# Patient Record
Sex: Female | Born: 1940 | Race: Black or African American | Hispanic: No | Marital: Single
Health system: Southern US, Community
[De-identification: ages and names within clinical notes are randomized; demographics above are authoritative.]

---

## 2005-11-01 ENCOUNTER — Ambulatory Visit (HOSPITAL_COMMUNITY): Admission: RE | Admit: 2005-11-01 | Discharge: 2005-11-02 | Payer: Self-pay | Admitting: Ophthalmology

## 2010-10-18 ENCOUNTER — Ambulatory Visit: Payer: Self-pay | Admitting: Gastroenterology

## 2010-10-25 ENCOUNTER — Ambulatory Visit: Payer: Self-pay | Admitting: Gastroenterology

## 2010-10-26 LAB — PATHOLOGY REPORT

## 2011-02-01 ENCOUNTER — Ambulatory Visit: Payer: Self-pay | Admitting: Family Medicine

## 2011-05-30 ENCOUNTER — Ambulatory Visit: Payer: Self-pay | Admitting: Gastroenterology

## 2011-05-31 LAB — PATHOLOGY REPORT

## 2011-06-23 ENCOUNTER — Ambulatory Visit: Payer: Self-pay | Admitting: Surgery

## 2011-06-23 LAB — BASIC METABOLIC PANEL
Calcium, Total: 9.2 mg/dL (ref 8.5–10.1)
Creatinine: 0.63 mg/dL (ref 0.60–1.30)
EGFR (Non-African Amer.): >60
Glucose: 98 mg/dL (ref 65–99)
Potassium: 3.4 mmol/L — ABNORMAL LOW (ref 3.5–5.1)
Sodium: 142 mmol/L (ref 136–145)

## 2011-06-30 ENCOUNTER — Inpatient Hospital Stay: Payer: Self-pay | Admitting: Surgery

## 2011-07-01 LAB — BASIC METABOLIC PANEL
Anion Gap: 9 (ref 7–16)
Chloride: 101 mmol/L (ref 98–107)
Co2: 31 mmol/L (ref 21–32)
Creatinine: 0.7 mg/dL (ref 0.60–1.30)
Glucose: 145 mg/dL — ABNORMAL HIGH (ref 65–99)
Osmolality: 281 (ref 275–301)
Potassium: 2.9 mmol/L — ABNORMAL LOW (ref 3.5–5.1)
Sodium: 141 mmol/L (ref 136–145)

## 2011-07-01 LAB — CBC WITH DIFFERENTIAL/PLATELET
Basophil #: 0 10*3/uL (ref 0.0–0.1)
Eosinophil #: 0 10*3/uL (ref 0.0–0.7)
Lymphocyte #: 1.6 10*3/uL (ref 1.0–3.6)
MCH: 26.8 pg (ref 26.0–34.0)
MCHC: 32.9 g/dL (ref 32.0–36.0)
Monocyte #: 1 10*3/uL — ABNORMAL HIGH (ref 0.0–0.7)
Neutrophil %: 74.6 %
Platelet: 151 10*3/uL (ref 150–440)
RDW: 14.6 % — ABNORMAL HIGH (ref 11.5–14.5)

## 2011-07-02 LAB — POTASSIUM: Potassium: 2.7 mmol/L — ABNORMAL LOW (ref 3.5–5.1)

## 2011-07-04 LAB — PATHOLOGY REPORT

## 2011-07-04 LAB — POTASSIUM: Potassium: 3.4 mmol/L — ABNORMAL LOW (ref 3.5–5.1)

## 2011-07-05 ENCOUNTER — Ambulatory Visit: Payer: Self-pay | Admitting: Oncology

## 2011-07-15 ENCOUNTER — Ambulatory Visit: Payer: Self-pay | Admitting: Oncology

## 2011-07-20 ENCOUNTER — Ambulatory Visit: Payer: Self-pay | Admitting: Surgery

## 2011-07-25 LAB — COMPREHENSIVE METABOLIC PANEL
Anion Gap: 9 (ref 7–16)
Bilirubin,Total: 0.5 mg/dL (ref 0.2–1.0)
Chloride: 101 mmol/L (ref 98–107)
Co2: 33 mmol/L — ABNORMAL HIGH (ref 21–32)
Creatinine: 0.68 mg/dL (ref 0.60–1.30)
EGFR (African American): >60
EGFR (Non-African Amer.): >60
Osmolality: 286 (ref 275–301)
Potassium: 3.8 mmol/L (ref 3.5–5.1)
SGPT (ALT): 22 U/L
Sodium: 143 mmol/L (ref 136–145)

## 2011-07-25 LAB — CBC CANCER CENTER
Basophil %: 0.6 %
Eosinophil %: 9.5 %
HCT: 34.5 % — ABNORMAL LOW (ref 35.0–47.0)
Lymphocyte #: 2.1 x10 3/mm (ref 1.0–3.6)
MCV: 80 fL (ref 80–100)
Monocyte %: 10.5 %
Neutrophil #: 3.8 x10 3/mm (ref 1.4–6.5)
RBC: 4.32 10*6/uL (ref 3.80–5.20)
WBC: 7.4 x10 3/mm (ref 3.6–11.0)

## 2011-07-26 LAB — CEA: CEA: 2.4 ng/mL (ref 0.0–4.7)

## 2011-08-01 LAB — BASIC METABOLIC PANEL
BUN: 10 mg/dL (ref 7–18)
Calcium, Total: 8.4 mg/dL — ABNORMAL LOW (ref 8.5–10.1)
Chloride: 102 mmol/L (ref 98–107)
Co2: 34 mmol/L — ABNORMAL HIGH (ref 21–32)
Creatinine: 0.69 mg/dL (ref 0.60–1.30)
Glucose: 115 mg/dL — ABNORMAL HIGH (ref 65–99)
Osmolality: 281 (ref 275–301)
Potassium: 3.8 mmol/L (ref 3.5–5.1)

## 2011-08-01 LAB — CBC CANCER CENTER
Basophil #: 0 x10 3/mm (ref 0.0–0.1)
Lymphocyte #: 1.8 x10 3/mm (ref 1.0–3.6)
MCH: 27.4 pg (ref 26.0–34.0)
MCHC: 34.4 g/dL (ref 32.0–36.0)
MCV: 80 fL (ref 80–100)
Monocyte #: 0.3 x10 3/mm (ref 0.0–0.7)
Monocyte %: 4.9 %
Neutrophil %: 50.8 %
Platelet: 136 x10 3/mm — ABNORMAL LOW (ref 150–440)
RDW: 14.9 % — ABNORMAL HIGH (ref 11.5–14.5)
WBC: 5.4 x10 3/mm (ref 3.6–11.0)

## 2011-08-08 ENCOUNTER — Emergency Department: Payer: Self-pay | Admitting: Emergency Medicine

## 2011-08-08 LAB — URINALYSIS, COMPLETE
Ketone: NEGATIVE
Nitrite: NEGATIVE
Ph: 7 (ref 4.5–8.0)
Protein: NEGATIVE
RBC,UR: 3 /HPF (ref 0–5)

## 2011-08-08 LAB — CBC CANCER CENTER
Basophil #: 0 x10 3/mm (ref 0.0–0.1)
Eosinophil #: 0.3 x10 3/mm (ref 0.0–0.7)
Lymphocyte #: 1.6 x10 3/mm (ref 1.0–3.6)
Lymphocyte %: 32.1 %
Monocyte #: 0.7 x10 3/mm (ref 0.0–0.7)
Neutrophil %: 44.8 %
Platelet: 149 x10 3/mm — ABNORMAL LOW (ref 150–440)
RBC: 4.3 10*6/uL (ref 3.80–5.20)
WBC: 4.8 x10 3/mm (ref 3.6–11.0)

## 2011-08-08 LAB — COMPREHENSIVE METABOLIC PANEL
Albumin: 3.6 g/dL (ref 3.4–5.0)
BUN: 9 mg/dL (ref 7–18)
EGFR (African American): >60
Glucose: 157 mg/dL — ABNORMAL HIGH (ref 65–99)
SGOT(AST): 21 U/L (ref 15–37)
SGPT (ALT): 25 U/L
Total Protein: 6.9 g/dL (ref 6.4–8.2)

## 2011-08-08 LAB — LIPID PANEL: HDL Cholesterol: 47 mg/dL (ref 40–60)

## 2011-08-08 LAB — CK TOTAL AND CKMB (NOT AT ARMC): CK, Total: 79 U/L (ref 21–215)

## 2011-08-12 ENCOUNTER — Ambulatory Visit: Payer: Self-pay | Admitting: Oncology

## 2011-08-22 LAB — CBC CANCER CENTER
Basophil #: 0 x10 3/mm (ref 0.0–0.1)
Basophil %: 0.5 %
Eosinophil #: 0 x10 3/mm (ref 0.0–0.7)
HCT: 31.9 % — ABNORMAL LOW (ref 35.0–47.0)
HGB: 11 g/dL — ABNORMAL LOW (ref 12.0–16.0)
Lymphocyte #: 1.9 x10 3/mm (ref 1.0–3.6)
MCH: 27.9 pg (ref 26.0–34.0)
MCHC: 34.4 g/dL (ref 32.0–36.0)
MCV: 81 fL (ref 80–100)
Neutrophil #: 1.4 x10 3/mm (ref 1.4–6.5)
RDW: 16.9 % — ABNORMAL HIGH (ref 11.5–14.5)

## 2011-08-22 LAB — COMPREHENSIVE METABOLIC PANEL
Albumin: 3.3 g/dL — ABNORMAL LOW (ref 3.4–5.0)
Anion Gap: 8 (ref 7–16)
Bilirubin,Total: 0.3 mg/dL (ref 0.2–1.0)
Calcium, Total: 8.4 mg/dL — ABNORMAL LOW (ref 8.5–10.1)
Co2: 32 mmol/L (ref 21–32)
Creatinine: 0.79 mg/dL (ref 0.60–1.30)
Glucose: 120 mg/dL — ABNORMAL HIGH (ref 65–99)
Osmolality: 287 (ref 275–301)
Potassium: 3.6 mmol/L (ref 3.5–5.1)
Sodium: 144 mmol/L (ref 136–145)

## 2011-09-05 LAB — COMPREHENSIVE METABOLIC PANEL
Albumin: 3.3 g/dL — ABNORMAL LOW (ref 3.4–5.0)
Alkaline Phosphatase: 66 U/L (ref 50–136)
Anion Gap: 7 (ref 7–16)
BUN: 9 mg/dL (ref 7–18)
Calcium, Total: 8.5 mg/dL (ref 8.5–10.1)
Chloride: 103 mmol/L (ref 98–107)
Creatinine: 0.87 mg/dL (ref 0.60–1.30)
Glucose: 147 mg/dL — ABNORMAL HIGH (ref 65–99)
Potassium: 3.3 mmol/L — ABNORMAL LOW (ref 3.5–5.1)
SGOT(AST): 22 U/L (ref 15–37)
SGPT (ALT): 33 U/L
Sodium: 141 mmol/L (ref 136–145)
Total Protein: 6.6 g/dL (ref 6.4–8.2)

## 2011-09-05 LAB — CBC CANCER CENTER
Basophil: 3 %
Eosinophil: 3 %
Lymphocytes: 46 %
MCH: 28.3 pg (ref 26.0–34.0)
MCV: 82 fL (ref 80–100)
Platelet: 121 x10 3/mm — ABNORMAL LOW (ref 150–440)

## 2011-09-12 ENCOUNTER — Ambulatory Visit: Payer: Self-pay | Admitting: Oncology

## 2011-09-19 LAB — CBC CANCER CENTER
Basophil #: 0 x10 3/mm (ref 0.0–0.1)
Basophil %: 0.9 %
Eosinophil %: 2.5 %
HCT: 32.2 % — ABNORMAL LOW (ref 35.0–47.0)
Lymphocyte #: 1.8 x10 3/mm (ref 1.0–3.6)
MCH: 28.3 pg (ref 26.0–34.0)
MCHC: 34.2 g/dL (ref 32.0–36.0)
MCV: 83 fL (ref 80–100)
Monocyte #: 0.7 x10 3/mm (ref 0.0–0.7)
Monocyte %: 17.2 %
Neutrophil %: 36.3 %
Platelet: 103 x10 3/mm — ABNORMAL LOW (ref 150–440)
RDW: 18.9 % — ABNORMAL HIGH (ref 11.5–14.5)
WBC: 4.2 x10 3/mm (ref 3.6–11.0)

## 2011-09-19 LAB — BASIC METABOLIC PANEL
Anion Gap: 8 (ref 7–16)
BUN: 14 mg/dL (ref 7–18)
Calcium, Total: 8.8 mg/dL (ref 8.5–10.1)
Chloride: 102 mmol/L (ref 98–107)
Creatinine: 0.76 mg/dL (ref 0.60–1.30)
EGFR (African American): >60
EGFR (Non-African Amer.): >60
Glucose: 133 mg/dL — ABNORMAL HIGH (ref 65–99)
Osmolality: 286 (ref 275–301)
Potassium: 3.7 mmol/L (ref 3.5–5.1)

## 2011-10-03 LAB — COMPREHENSIVE METABOLIC PANEL
Albumin: 3.3 g/dL — ABNORMAL LOW (ref 3.4–5.0)
Alkaline Phosphatase: 53 U/L (ref 50–136)
Anion Gap: 10 (ref 7–16)
BUN: 10 mg/dL (ref 7–18)
Calcium, Total: 8.4 mg/dL — ABNORMAL LOW (ref 8.5–10.1)
Chloride: 102 mmol/L (ref 98–107)
Co2: 30 mmol/L (ref 21–32)
EGFR (African American): >60
EGFR (Non-African Amer.): >60
Glucose: 163 mg/dL — ABNORMAL HIGH (ref 65–99)
Osmolality: 286 (ref 275–301)
Potassium: 3.6 mmol/L (ref 3.5–5.1)
SGOT(AST): 28 U/L (ref 15–37)
SGPT (ALT): 34 U/L
Sodium: 142 mmol/L (ref 136–145)
Total Protein: 6.4 g/dL (ref 6.4–8.2)

## 2011-10-03 LAB — CBC CANCER CENTER
Basophil %: 1.4 %
Eosinophil #: 0.1 x10 3/mm (ref 0.0–0.7)
HGB: 11.5 g/dL — ABNORMAL LOW (ref 12.0–16.0)
Lymphocyte #: 1.5 x10 3/mm (ref 1.0–3.6)
Lymphocyte %: 41.8 %
MCHC: 33.2 g/dL (ref 32.0–36.0)
MCV: 84 fL (ref 80–100)
Neutrophil #: 1.3 x10 3/mm — ABNORMAL LOW (ref 1.4–6.5)
Neutrophil %: 35.1 %
RBC: 4.14 10*6/uL (ref 3.80–5.20)
RDW: 20.2 % — ABNORMAL HIGH (ref 11.5–14.5)
WBC: 3.6 x10 3/mm (ref 3.6–11.0)

## 2011-10-10 LAB — COMPREHENSIVE METABOLIC PANEL
Alkaline Phosphatase: 59 U/L (ref 50–136)
Calcium, Total: 8.9 mg/dL (ref 8.5–10.1)
Co2: 30 mmol/L (ref 21–32)
Creatinine: 0.75 mg/dL (ref 0.60–1.30)
EGFR (African American): >60
Glucose: 118 mg/dL — ABNORMAL HIGH (ref 65–99)
Osmolality: 282 (ref 275–301)
Potassium: 3.9 mmol/L (ref 3.5–5.1)
SGOT(AST): 34 U/L (ref 15–37)
SGPT (ALT): 30 U/L
Total Protein: 6.5 g/dL (ref 6.4–8.2)

## 2011-10-10 LAB — CBC CANCER CENTER
Basophil #: 0 x10 3/mm (ref 0.0–0.1)
Basophil %: 0.8 %
Eosinophil #: 0.2 x10 3/mm (ref 0.0–0.7)
Eosinophil %: 3.1 %
HCT: 35.6 % (ref 35.0–47.0)
HGB: 11.8 g/dL — ABNORMAL LOW (ref 12.0–16.0)
Lymphocyte #: 2 x10 3/mm (ref 1.0–3.6)
Lymphocyte %: 34.1 %
MCH: 27.9 pg (ref 26.0–34.0)
MCHC: 33.2 g/dL (ref 32.0–36.0)
Monocyte #: 1.3 x10 3/mm — ABNORMAL HIGH (ref 0.2–0.9)
Neutrophil #: 2.4 x10 3/mm (ref 1.4–6.5)
Neutrophil %: 40.1 %
Platelet: 146 x10 3/mm — ABNORMAL LOW (ref 150–440)
RBC: 4.23 10*6/uL (ref 3.80–5.20)
RDW: 20.8 % — ABNORMAL HIGH (ref 11.5–14.5)
WBC: 5.9 x10 3/mm (ref 3.6–11.0)

## 2011-10-12 ENCOUNTER — Ambulatory Visit: Payer: Self-pay | Admitting: Oncology

## 2011-10-17 LAB — CBC CANCER CENTER
Basophil #: 0.1 x10 3/mm (ref 0.0–0.1)
Eosinophil #: 0.1 x10 3/mm (ref 0.0–0.7)
Eosinophil %: 1.8 %
HCT: 34.2 % — ABNORMAL LOW (ref 35.0–47.0)
HGB: 11.2 g/dL — ABNORMAL LOW (ref 12.0–16.0)
Lymphocyte #: 1.6 x10 3/mm (ref 1.0–3.6)
Lymphocyte %: 34.9 %
MCH: 27.8 pg (ref 26.0–34.0)
Monocyte %: 3.3 %
WBC: 4.5 x10 3/mm (ref 3.6–11.0)

## 2011-10-24 LAB — CBC CANCER CENTER
Basophil #: 0 x10 3/mm (ref 0.0–0.1)
Basophil %: 0.6 %
Eosinophil %: 4 %
HCT: 32.9 % — ABNORMAL LOW (ref 35.0–47.0)
HGB: 11.1 g/dL — ABNORMAL LOW (ref 12.0–16.0)
Lymphocyte %: 39.5 %
MCV: 85 fL (ref 80–100)
Monocyte %: 14.2 %
Neutrophil %: 41.7 %
Platelet: 84 x10 3/mm — ABNORMAL LOW (ref 150–440)
RDW: 19.6 % — ABNORMAL HIGH (ref 11.5–14.5)

## 2011-10-24 LAB — COMPREHENSIVE METABOLIC PANEL
Albumin: 3.2 g/dL — ABNORMAL LOW (ref 3.4–5.0)
Anion Gap: 9 (ref 7–16)
BUN: 9 mg/dL (ref 7–18)
Bilirubin,Total: 0.4 mg/dL (ref 0.2–1.0)
Chloride: 101 mmol/L (ref 98–107)
Co2: 31 mmol/L (ref 21–32)
Creatinine: 0.72 mg/dL (ref 0.60–1.30)
EGFR (African American): >60
Osmolality: 283 (ref 275–301)
Potassium: 3.4 mmol/L — ABNORMAL LOW (ref 3.5–5.1)
SGOT(AST): 29 U/L (ref 15–37)
SGPT (ALT): 31 U/L
Sodium: 141 mmol/L (ref 136–145)
Total Protein: 6.3 g/dL — ABNORMAL LOW (ref 6.4–8.2)

## 2011-10-31 LAB — COMPREHENSIVE METABOLIC PANEL
Alkaline Phosphatase: 70 U/L (ref 50–136)
Anion Gap: 8 (ref 7–16)
Chloride: 106 mmol/L (ref 98–107)
Co2: 31 mmol/L (ref 21–32)
Creatinine: 0.74 mg/dL (ref 0.60–1.30)
EGFR (African American): >60
Glucose: 128 mg/dL — ABNORMAL HIGH (ref 65–99)
Osmolality: 289 (ref 275–301)
Potassium: 3.8 mmol/L (ref 3.5–5.1)
SGPT (ALT): 30 U/L
Sodium: 145 mmol/L (ref 136–145)
Total Protein: 6.5 g/dL (ref 6.4–8.2)

## 2011-10-31 LAB — CBC CANCER CENTER
Basophil #: 0.1 x10 3/mm (ref 0.0–0.1)
Basophil %: 1.2 %
Eosinophil #: 0.1 x10 3/mm (ref 0.0–0.7)
Eosinophil %: 3.2 %
HGB: 11.3 g/dL — ABNORMAL LOW (ref 12.0–16.0)
Lymphocyte #: 1.9 x10 3/mm (ref 1.0–3.6)
Lymphocyte %: 40 %
MCV: 86 fL (ref 80–100)
Monocyte %: 23.3 %
Platelet: 200 x10 3/mm (ref 150–440)
WBC: 4.6 x10 3/mm (ref 3.6–11.0)

## 2011-11-08 LAB — CBC CANCER CENTER
Basophil %: 0.8 %
Eosinophil #: 0.1 x10 3/mm (ref 0.0–0.7)
HCT: 33.8 % — ABNORMAL LOW (ref 35.0–47.0)
Lymphocyte %: 32.5 %
MCH: 28.6 pg (ref 26.0–34.0)
Neutrophil #: 3.7 x10 3/mm (ref 1.4–6.5)
Neutrophil %: 59.1 %
RDW: 17.8 % — ABNORMAL HIGH (ref 11.5–14.5)
WBC: 6.2 x10 3/mm (ref 3.6–11.0)

## 2011-11-12 ENCOUNTER — Ambulatory Visit: Payer: Self-pay | Admitting: Oncology

## 2011-11-14 LAB — CBC CANCER CENTER
Basophil %: 0.9 %
Eosinophil #: 0.2 x10 3/mm (ref 0.0–0.7)
HCT: 34 % — ABNORMAL LOW (ref 35.0–47.0)
HGB: 11.3 g/dL — ABNORMAL LOW (ref 12.0–16.0)
Lymphocyte %: 39.6 %
MCH: 28.6 pg (ref 26.0–34.0)
MCHC: 33.3 g/dL (ref 32.0–36.0)
Monocyte #: 0.6 x10 3/mm (ref 0.2–0.9)
Neutrophil #: 1.5 x10 3/mm (ref 1.4–6.5)
RBC: 3.95 10*6/uL (ref 3.80–5.20)
RDW: 18.3 % — ABNORMAL HIGH (ref 11.5–14.5)
WBC: 3.9 x10 3/mm (ref 3.6–11.0)

## 2011-11-14 LAB — COMPREHENSIVE METABOLIC PANEL
Alkaline Phosphatase: 74 U/L (ref 50–136)
Calcium, Total: 8.6 mg/dL (ref 8.5–10.1)
Chloride: 105 mmol/L (ref 98–107)
EGFR (Non-African Amer.): >60
Potassium: 3.5 mmol/L (ref 3.5–5.1)
SGPT (ALT): 26 U/L

## 2011-11-21 LAB — CBC CANCER CENTER
Basophil #: 0.1 x10 3/mm (ref 0.0–0.1)
Basophil %: 1.2 %
Lymphocyte %: 41.4 %
MCH: 28.7 pg (ref 26.0–34.0)
Monocyte #: 1.3 x10 3/mm — ABNORMAL HIGH (ref 0.2–0.9)
Monocyte %: 26.4 %
Neutrophil #: 1.3 x10 3/mm — ABNORMAL LOW (ref 1.4–6.5)
Neutrophil %: 26.6 %
RBC: 4.03 10*6/uL (ref 3.80–5.20)

## 2011-11-28 LAB — CBC CANCER CENTER
Basophil #: 0.1 x10 3/mm (ref 0.0–0.1)
Eosinophil #: 0.1 x10 3/mm (ref 0.0–0.7)
Eosinophil %: 2 %
HCT: 34.1 % — ABNORMAL LOW (ref 35.0–47.0)
HGB: 11.5 g/dL — ABNORMAL LOW (ref 12.0–16.0)
Lymphocyte %: 38.7 %
MCH: 28.7 pg (ref 26.0–34.0)
MCHC: 33.7 g/dL (ref 32.0–36.0)
MCV: 85 fL (ref 80–100)
Monocyte %: 4 %
Neutrophil %: 53.9 %
RBC: 4 10*6/uL (ref 3.80–5.20)

## 2011-12-12 ENCOUNTER — Ambulatory Visit: Payer: Self-pay | Admitting: Oncology

## 2011-12-12 LAB — COMPREHENSIVE METABOLIC PANEL WITH GFR
Albumin: 3.2 g/dL — ABNORMAL LOW
Alkaline Phosphatase: 90 U/L
Anion Gap: 8
BUN: 9 mg/dL
Bilirubin,Total: 0.3 mg/dL
Calcium, Total: 8.4 mg/dL — ABNORMAL LOW
Chloride: 102 mmol/L
Co2: 31 mmol/L
Creatinine: 0.72 mg/dL
EGFR (African American): >60
EGFR (Non-African Amer.): >60
Glucose: 162 mg/dL — ABNORMAL HIGH
Osmolality: 283
Potassium: 3.8 mmol/L
SGOT(AST): 30 U/L
SGPT (ALT): 30 U/L
Sodium: 141 mmol/L
Total Protein: 6.6 g/dL

## 2011-12-12 LAB — CBC CANCER CENTER
Basophil #: 0 "x10 3/mm "
Basophil %: 1.2 %
Eosinophil #: 0.2 "x10 3/mm "
Eosinophil %: 4.2 %
HCT: 34.3 % — ABNORMAL LOW
HGB: 11.6 g/dL — ABNORMAL LOW
Lymphocyte %: 43.3 %
Lymphs Abs: 1.8 "x10 3/mm "
MCH: 29.1 pg
MCHC: 33.7 g/dL
MCV: 86 fL
Monocyte #: 1 "x10 3/mm " — ABNORMAL HIGH
Monocyte %: 24.8 %
Neutrophil #: 1.1 "x10 3/mm " — ABNORMAL LOW
Neutrophil %: 26.5 %
Platelet: 170 "x10 3/mm "
RBC: 3.98 "x10 6/mm "
RDW: 16.7 % — ABNORMAL HIGH
WBC: 4.2 "x10 3/mm "

## 2012-01-02 LAB — CBC CANCER CENTER
Basophil %: 1.4 %
Eosinophil %: 4.4 %
HGB: 11.8 g/dL — ABNORMAL LOW (ref 12.0–16.0)
Lymphocyte #: 1.7 x10 3/mm (ref 1.0–3.6)
Lymphocyte %: 41.4 %
Monocyte %: 23.2 %
Neutrophil %: 29.6 %
RBC: 4.15 10*6/uL (ref 3.80–5.20)
WBC: 4.1 x10 3/mm (ref 3.6–11.0)

## 2012-01-02 LAB — COMPREHENSIVE METABOLIC PANEL
Albumin: 3.2 g/dL — ABNORMAL LOW (ref 3.4–5.0)
BUN: 9 mg/dL (ref 7–18)
Creatinine: 0.77 mg/dL (ref 0.60–1.30)
Glucose: 185 mg/dL — ABNORMAL HIGH (ref 65–99)
Potassium: 3.7 mmol/L (ref 3.5–5.1)
SGOT(AST): 30 U/L (ref 15–37)
Sodium: 142 mmol/L (ref 136–145)
Total Protein: 6.7 g/dL (ref 6.4–8.2)

## 2012-01-12 ENCOUNTER — Ambulatory Visit: Payer: Self-pay | Admitting: Oncology

## 2012-01-23 LAB — COMPREHENSIVE METABOLIC PANEL
Bilirubin,Total: 0.4 mg/dL (ref 0.2–1.0)
Calcium, Total: 9 mg/dL (ref 8.5–10.1)
Chloride: 100 mmol/L (ref 98–107)
Co2: 32 mmol/L (ref 21–32)
Creatinine: 0.81 mg/dL (ref 0.60–1.30)
EGFR (African American): >60
EGFR (Non-African Amer.): >60
SGOT(AST): 32 U/L (ref 15–37)
SGPT (ALT): 34 U/L (ref 12–78)

## 2012-01-23 LAB — CBC CANCER CENTER
Eosinophil %: 3.2 %
Lymphocyte #: 1.9 x10 3/mm (ref 1.0–3.6)
MCH: 28.6 pg (ref 26.0–34.0)
MCHC: 33.9 g/dL (ref 32.0–36.0)
MCV: 84 fL (ref 80–100)
Monocyte %: 25.1 %
Neutrophil #: 1.1 x10 3/mm — ABNORMAL LOW (ref 1.4–6.5)
Neutrophil %: 25.7 %
Platelet: 180 x10 3/mm (ref 150–440)
RDW: 17.5 % — ABNORMAL HIGH (ref 11.5–14.5)
WBC: 4.2 x10 3/mm (ref 3.6–11.0)

## 2012-02-12 ENCOUNTER — Ambulatory Visit: Payer: Self-pay | Admitting: Oncology

## 2012-02-14 LAB — CBC CANCER CENTER
Basophil #: 0.1 x10 3/mm (ref 0.0–0.1)
HGB: 11.5 g/dL — ABNORMAL LOW (ref 12.0–16.0)
Lymphocyte #: 1.9 x10 3/mm (ref 1.0–3.6)
MCHC: 33.4 g/dL (ref 32.0–36.0)
Monocyte %: 24.1 %
Neutrophil #: 1.8 x10 3/mm (ref 1.4–6.5)
RDW: 17.6 % — ABNORMAL HIGH (ref 11.5–14.5)
WBC: 5.1 x10 3/mm (ref 3.6–11.0)

## 2012-02-14 LAB — COMPREHENSIVE METABOLIC PANEL
Albumin: 3.4 g/dL (ref 3.4–5.0)
Anion Gap: 5 — ABNORMAL LOW (ref 7–16)
BUN: 10 mg/dL (ref 7–18)
Bilirubin,Total: 0.3 mg/dL (ref 0.2–1.0)
Chloride: 103 mmol/L (ref 98–107)
Creatinine: 0.76 mg/dL (ref 0.60–1.30)
Glucose: 126 mg/dL — ABNORMAL HIGH (ref 65–99)
Potassium: 3.8 mmol/L (ref 3.5–5.1)
SGOT(AST): 34 U/L (ref 15–37)
Sodium: 141 mmol/L (ref 136–145)
Total Protein: 6.9 g/dL (ref 6.4–8.2)

## 2012-02-22 ENCOUNTER — Ambulatory Visit: Payer: Self-pay | Admitting: Family Medicine

## 2012-03-13 ENCOUNTER — Ambulatory Visit: Payer: Self-pay | Admitting: Oncology

## 2012-03-13 LAB — COMPREHENSIVE METABOLIC PANEL
Albumin: 3.3 g/dL — ABNORMAL LOW (ref 3.4–5.0)
Anion Gap: 9 (ref 7–16)
Calcium, Total: 8.7 mg/dL (ref 8.5–10.1)
Chloride: 103 mmol/L (ref 98–107)
EGFR (African American): >60
EGFR (Non-African Amer.): >60
Glucose: 138 mg/dL — ABNORMAL HIGH (ref 65–99)
Potassium: 3.6 mmol/L (ref 3.5–5.1)
Sodium: 141 mmol/L (ref 136–145)
Total Protein: 7 g/dL (ref 6.4–8.2)

## 2012-03-13 LAB — CBC CANCER CENTER
Eosinophil %: 2.8 %
Lymphocyte %: 28.8 %
MCH: 28 pg (ref 26.0–34.0)
Monocyte #: 0.9 x10 3/mm (ref 0.2–0.9)
Neutrophil %: 52.4 %
Platelet: 148 x10 3/mm — ABNORMAL LOW (ref 150–440)
RBC: 4.25 10*6/uL (ref 3.80–5.20)
WBC: 6.1 x10 3/mm (ref 3.6–11.0)

## 2012-04-13 ENCOUNTER — Ambulatory Visit: Payer: Self-pay | Admitting: Oncology

## 2012-05-13 ENCOUNTER — Ambulatory Visit: Payer: Self-pay | Admitting: Oncology

## 2012-05-15 LAB — CBC CANCER CENTER
Basophil %: 1.2 %
Eosinophil %: 4.9 %
HGB: 12.4 g/dL (ref 12.0–16.0)
Lymphocyte %: 32.8 %
Monocyte %: 11.5 %
Neutrophil %: 49.6 %
Platelet: 146 x10 3/mm — ABNORMAL LOW (ref 150–440)
RBC: 4.54 10*6/uL (ref 3.80–5.20)
WBC: 6.7 x10 3/mm (ref 3.6–11.0)

## 2012-05-15 LAB — COMPREHENSIVE METABOLIC PANEL
Albumin: 3.5 g/dL (ref 3.4–5.0)
BUN: 14 mg/dL (ref 7–18)
Chloride: 103 mmol/L (ref 98–107)
EGFR (African American): >60
Glucose: 119 mg/dL — ABNORMAL HIGH (ref 65–99)
SGOT(AST): 26 U/L (ref 15–37)
SGPT (ALT): 32 U/L (ref 12–78)
Total Protein: 6.8 g/dL (ref 6.4–8.2)

## 2012-05-16 LAB — CEA: CEA: 2 ng/mL (ref 0.0–4.7)

## 2012-06-13 ENCOUNTER — Ambulatory Visit: Payer: Self-pay | Admitting: Oncology

## 2012-07-14 ENCOUNTER — Ambulatory Visit: Payer: Self-pay | Admitting: Oncology

## 2012-08-11 ENCOUNTER — Ambulatory Visit: Payer: Self-pay | Admitting: Oncology

## 2012-08-20 LAB — CBC CANCER CENTER
Basophil #: 0.1 x10 3/mm (ref 0.0–0.1)
Basophil %: 1.1 %
Eosinophil #: 0.2 x10 3/mm (ref 0.0–0.7)
Lymphocyte #: 2.3 x10 3/mm (ref 1.0–3.6)
MCHC: 34.5 g/dL (ref 32.0–36.0)
MCV: 79 fL — ABNORMAL LOW (ref 80–100)
Monocyte %: 10.7 %
Neutrophil %: 49.2 %
Platelet: 134 x10 3/mm — ABNORMAL LOW (ref 150–440)
RBC: 4.8 10*6/uL (ref 3.80–5.20)
RDW: 15.4 % — ABNORMAL HIGH (ref 11.5–14.5)
WBC: 6.5 x10 3/mm (ref 3.6–11.0)

## 2012-09-11 ENCOUNTER — Ambulatory Visit: Payer: Self-pay | Admitting: Oncology

## 2012-10-11 ENCOUNTER — Ambulatory Visit: Payer: Self-pay | Admitting: Oncology

## 2012-11-11 ENCOUNTER — Ambulatory Visit: Payer: Self-pay | Admitting: Oncology

## 2012-11-13 ENCOUNTER — Emergency Department: Payer: Self-pay | Admitting: Emergency Medicine

## 2012-11-13 LAB — CBC WITH DIFFERENTIAL/PLATELET
Basophil %: 0.7 %
Eosinophil #: 0.3 10*3/uL (ref 0.0–0.7)
Eosinophil %: 3.5 %
HCT: 40.6 % (ref 35.0–47.0)
HGB: 14.3 g/dL (ref 12.0–16.0)
Lymphocyte #: 3 10*3/uL (ref 1.0–3.6)
Lymphocyte %: 37.1 %
MCH: 27.7 pg (ref 26.0–34.0)
MCHC: 35.2 g/dL (ref 32.0–36.0)
Monocyte %: 11.1 %
Neutrophil #: 3.9 10*3/uL (ref 1.4–6.5)
Platelet: 161 10*3/uL (ref 150–440)
RBC: 5.16 10*6/uL (ref 3.80–5.20)
RDW: 15.2 % — ABNORMAL HIGH (ref 11.5–14.5)
WBC: 8.2 10*3/uL (ref 3.6–11.0)

## 2012-11-13 LAB — COMPREHENSIVE METABOLIC PANEL
Alkaline Phosphatase: 66 U/L (ref 50–136)
Anion Gap: 5 — ABNORMAL LOW (ref 7–16)
Bilirubin,Total: 0.8 mg/dL (ref 0.2–1.0)
Chloride: 101 mmol/L (ref 98–107)
Creatinine: 0.78 mg/dL (ref 0.60–1.30)
Potassium: 3.4 mmol/L — ABNORMAL LOW (ref 3.5–5.1)
SGPT (ALT): 30 U/L (ref 12–78)
Sodium: 138 mmol/L (ref 136–145)

## 2012-11-13 LAB — URINALYSIS, COMPLETE
Bacteria: NONE SEEN
Glucose,UR: NEGATIVE mg/dL (ref 0–75)
Ketone: NEGATIVE
Leukocyte Esterase: NEGATIVE
RBC,UR: 2 /HPF (ref 0–5)
Specific Gravity: 1.006 (ref 1.003–1.030)
WBC UR: <1 /HPF (ref 0–5)

## 2012-11-13 LAB — MAGNESIUM: Magnesium: 1.4 mg/dL — ABNORMAL LOW

## 2012-11-26 LAB — CBC CANCER CENTER
Basophil %: 1.4 %
Eosinophil #: 0.3 x10 3/mm (ref 0.0–0.7)
HCT: 37.5 % (ref 35.0–47.0)
HGB: 13.3 g/dL (ref 12.0–16.0)
Lymphocyte #: 2.7 x10 3/mm (ref 1.0–3.6)
MCH: 28 pg (ref 26.0–34.0)
MCHC: 35.3 g/dL (ref 32.0–36.0)
MCV: 79 fL — ABNORMAL LOW (ref 80–100)
Monocyte %: 11.9 %
Neutrophil %: 47.7 %
Platelet: 152 x10 3/mm (ref 150–440)
RBC: 4.74 10*6/uL (ref 3.80–5.20)
RDW: 14.7 % — ABNORMAL HIGH (ref 11.5–14.5)

## 2012-11-26 LAB — MAGNESIUM: Magnesium: 1.8 mg/dL

## 2012-11-27 LAB — CEA: CEA: 2.2 ng/mL (ref 0.0–4.7)

## 2012-12-06 ENCOUNTER — Ambulatory Visit: Payer: Self-pay | Admitting: Gastroenterology

## 2012-12-11 ENCOUNTER — Ambulatory Visit: Payer: Self-pay | Admitting: Oncology

## 2013-01-11 ENCOUNTER — Ambulatory Visit: Payer: Self-pay | Admitting: Oncology

## 2013-02-15 ENCOUNTER — Ambulatory Visit: Payer: Self-pay | Admitting: Oncology

## 2013-02-22 ENCOUNTER — Ambulatory Visit: Payer: Self-pay | Admitting: Family Medicine

## 2013-03-05 LAB — CBC CANCER CENTER
Basophil #: 0.1 x10 3/mm (ref 0.0–0.1)
Eosinophil %: 3.7 %
HGB: 13.4 g/dL (ref 12.0–16.0)
Lymphocyte #: 2.6 x10 3/mm (ref 1.0–3.6)
Lymphocyte %: 33.6 %
MCH: 27.2 pg (ref 26.0–34.0)
Monocyte #: 0.8 x10 3/mm (ref 0.2–0.9)
Neutrophil %: 50.7 %
RBC: 4.92 10*6/uL (ref 3.80–5.20)
RDW: 14.5 % (ref 11.5–14.5)
WBC: 7.7 x10 3/mm (ref 3.6–11.0)

## 2013-03-07 LAB — CEA: CEA: 2.4 ng/mL (ref 0.0–4.7)

## 2013-03-13 ENCOUNTER — Ambulatory Visit: Payer: Self-pay | Admitting: Oncology

## 2013-04-13 ENCOUNTER — Ambulatory Visit: Payer: Self-pay | Admitting: Oncology

## 2013-05-13 ENCOUNTER — Ambulatory Visit: Payer: Self-pay | Admitting: Oncology

## 2013-06-13 ENCOUNTER — Ambulatory Visit: Payer: Self-pay | Admitting: Oncology

## 2013-07-12 ENCOUNTER — Ambulatory Visit: Payer: Self-pay | Admitting: Oncology

## 2013-07-12 LAB — CBC CANCER CENTER
BASOS ABS: 0.1 x10 3/mm (ref 0.0–0.1)
Basophil %: 0.7 %
Eosinophil #: 0.3 x10 3/mm (ref 0.0–0.7)
Eosinophil %: 4.5 %
HCT: 37.8 % (ref 35.0–47.0)
HGB: 12.8 g/dL (ref 12.0–16.0)
LYMPHS PCT: 31.7 %
Lymphocyte #: 2.5 x10 3/mm (ref 1.0–3.6)
MCH: 26.6 pg (ref 26.0–34.0)
MCHC: 34 g/dL (ref 32.0–36.0)
MCV: 78 fL — ABNORMAL LOW (ref 80–100)
Monocyte #: 0.8 x10 3/mm (ref 0.2–0.9)
Monocyte %: 10.7 %
Neutrophil #: 4.1 x10 3/mm (ref 1.4–6.5)
Neutrophil %: 52.4 %
PLATELETS: 161 x10 3/mm (ref 150–440)
RBC: 4.82 10*6/uL (ref 3.80–5.20)
RDW: 14.4 % (ref 11.5–14.5)
WBC: 7.7 x10 3/mm (ref 3.6–11.0)

## 2013-07-13 LAB — CEA: CEA: 5.2 ng/mL — ABNORMAL HIGH (ref 0.0–4.7)

## 2013-07-14 ENCOUNTER — Ambulatory Visit: Payer: Self-pay | Admitting: Oncology

## 2013-08-19 ENCOUNTER — Ambulatory Visit: Payer: Self-pay | Admitting: Oncology

## 2013-09-11 ENCOUNTER — Ambulatory Visit: Payer: Self-pay | Admitting: Oncology

## 2013-10-11 ENCOUNTER — Ambulatory Visit: Payer: Self-pay | Admitting: Oncology

## 2013-11-15 ENCOUNTER — Ambulatory Visit: Payer: Self-pay | Admitting: Oncology

## 2013-11-15 LAB — CBC CANCER CENTER
BASOS ABS: 0.1 x10 3/mm (ref 0.0–0.1)
Basophil %: 1.1 %
Eosinophil #: 0.4 x10 3/mm (ref 0.0–0.7)
Eosinophil %: 4.7 %
HCT: 37.4 % (ref 35.0–47.0)
HGB: 12.7 g/dL (ref 12.0–16.0)
LYMPHS ABS: 2.1 x10 3/mm (ref 1.0–3.6)
Lymphocyte %: 28.9 %
MCH: 26.3 pg (ref 26.0–34.0)
MCHC: 33.8 g/dL (ref 32.0–36.0)
MCV: 78 fL — ABNORMAL LOW (ref 80–100)
Monocyte #: 0.8 x10 3/mm (ref 0.2–0.9)
Monocyte %: 10.6 %
Neutrophil #: 4.1 x10 3/mm (ref 1.4–6.5)
Neutrophil %: 54.7 %
Platelet: 175 x10 3/mm (ref 150–440)
RBC: 4.82 10*6/uL (ref 3.80–5.20)
RDW: 14.8 % — ABNORMAL HIGH (ref 11.5–14.5)
WBC: 7.4 x10 3/mm (ref 3.6–11.0)

## 2013-11-18 LAB — CEA: CEA: 30.9 ng/mL — ABNORMAL HIGH (ref 0.0–4.7)

## 2013-12-11 ENCOUNTER — Ambulatory Visit: Payer: Self-pay | Admitting: Oncology

## 2014-01-11 ENCOUNTER — Ambulatory Visit: Payer: Self-pay | Admitting: Oncology

## 2014-01-13 LAB — CEA: CEA: 49 ng/mL — ABNORMAL HIGH (ref 0.0–4.7)

## 2014-02-11 ENCOUNTER — Ambulatory Visit: Payer: Self-pay | Admitting: Oncology

## 2014-02-26 ENCOUNTER — Ambulatory Visit: Payer: Self-pay | Admitting: Family Medicine

## 2014-02-28 ENCOUNTER — Other Ambulatory Visit: Payer: Self-pay | Admitting: Neurology

## 2014-02-28 DIAGNOSIS — M6281 Muscle weakness (generalized): Secondary | ICD-10-CM

## 2014-02-28 DIAGNOSIS — R410 Disorientation, unspecified: Secondary | ICD-10-CM

## 2014-03-06 ENCOUNTER — Ambulatory Visit: Payer: Self-pay | Admitting: Neurology

## 2014-03-06 LAB — CREATININE, SERUM
Creatinine: 0.79 mg/dL (ref 0.60–1.30)
EGFR (Non-African Amer.): >60

## 2014-03-10 LAB — COMPREHENSIVE METABOLIC PANEL
AST: 32 U/L (ref 15–37)
Albumin: 3.7 g/dL (ref 3.4–5.0)
Alkaline Phosphatase: 62 U/L
Anion Gap: 8 (ref 7–16)
BILIRUBIN TOTAL: 0.5 mg/dL (ref 0.2–1.0)
BUN: 20 mg/dL — ABNORMAL HIGH (ref 7–18)
CO2: 29 mmol/L (ref 21–32)
Calcium, Total: 9.6 mg/dL (ref 8.5–10.1)
Chloride: 96 mmol/L — ABNORMAL LOW (ref 98–107)
Creatinine: 0.79 mg/dL (ref 0.60–1.30)
EGFR (Non-African Amer.): >60
Glucose: 185 mg/dL — ABNORMAL HIGH (ref 65–99)
Osmolality: 274 (ref 275–301)
POTASSIUM: 3.5 mmol/L (ref 3.5–5.1)
SGPT (ALT): 45 U/L
SODIUM: 133 mmol/L — AB (ref 136–145)
TOTAL PROTEIN: 7.2 g/dL (ref 6.4–8.2)

## 2014-03-10 LAB — CBC CANCER CENTER
BASOS PCT: 0.1 %
Basophil #: 0 x10 3/mm (ref 0.0–0.1)
EOS PCT: 0 %
Eosinophil #: 0 x10 3/mm (ref 0.0–0.7)
HCT: 39.3 % (ref 35.0–47.0)
HGB: 13.2 g/dL (ref 12.0–16.0)
LYMPHS ABS: 1.2 x10 3/mm (ref 1.0–3.6)
Lymphocyte %: 9.2 %
MCH: 25.6 pg — AB (ref 26.0–34.0)
MCHC: 33.7 g/dL (ref 32.0–36.0)
MCV: 76 fL — ABNORMAL LOW (ref 80–100)
Monocyte #: 0.8 x10 3/mm (ref 0.2–0.9)
Monocyte %: 6.3 %
NEUTROS ABS: 11.2 x10 3/mm — AB (ref 1.4–6.5)
NEUTROS PCT: 84.4 %
PLATELETS: 290 x10 3/mm (ref 150–440)
RBC: 5.17 10*6/uL (ref 3.80–5.20)
RDW: 15.3 % — AB (ref 11.5–14.5)
WBC: 13.3 x10 3/mm — AB (ref 3.6–11.0)

## 2014-03-10 LAB — APTT: Activated PTT: 25.1 secs (ref 23.6–35.9)

## 2014-03-10 LAB — PROTIME-INR
INR: 1.1
PROTHROMBIN TIME: 13.9 s (ref 11.5–14.7)

## 2014-03-11 LAB — CEA: CEA: 87.3 ng/mL — ABNORMAL HIGH (ref 0.0–4.7)

## 2014-03-13 ENCOUNTER — Other Ambulatory Visit: Payer: Self-pay

## 2014-03-13 ENCOUNTER — Ambulatory Visit: Payer: Self-pay | Admitting: Oncology

## 2014-03-17 ENCOUNTER — Ambulatory Visit: Payer: Self-pay | Admitting: Internal Medicine

## 2014-03-21 LAB — COMPREHENSIVE METABOLIC PANEL
ALBUMIN: 3 g/dL — AB (ref 3.4–5.0)
ALK PHOS: 77 U/L
Anion Gap: 7 (ref 7–16)
BUN: 26 mg/dL — ABNORMAL HIGH (ref 7–18)
Bilirubin,Total: 1.1 mg/dL — ABNORMAL HIGH (ref 0.2–1.0)
CHLORIDE: 97 mmol/L — AB (ref 98–107)
CO2: 30 mmol/L (ref 21–32)
CREATININE: 0.77 mg/dL (ref 0.60–1.30)
Calcium, Total: 8.7 mg/dL (ref 8.5–10.1)
EGFR (African American): >60
GLUCOSE: 344 mg/dL — AB (ref 65–99)
OSMOLALITY: 287 (ref 275–301)
Potassium: 4.6 mmol/L (ref 3.5–5.1)
SGOT(AST): 24 U/L (ref 15–37)
SGPT (ALT): 71 U/L — ABNORMAL HIGH
Sodium: 134 mmol/L — ABNORMAL LOW (ref 136–145)
TOTAL PROTEIN: 6.2 g/dL — AB (ref 6.4–8.2)

## 2014-03-21 LAB — CBC CANCER CENTER
BASOS PCT: 0.1 %
Basophil #: 0 x10 3/mm (ref 0.0–0.1)
Eosinophil #: 0 x10 3/mm (ref 0.0–0.7)
Eosinophil %: 0 %
HCT: 41.4 % (ref 35.0–47.0)
HGB: 13.8 g/dL (ref 12.0–16.0)
Lymphocyte #: 0.7 x10 3/mm — ABNORMAL LOW (ref 1.0–3.6)
Lymphocyte %: 3.7 %
MCH: 25.4 pg — ABNORMAL LOW (ref 26.0–34.0)
MCHC: 33.4 g/dL (ref 32.0–36.0)
MCV: 76 fL — AB (ref 80–100)
MONOS PCT: 5.3 %
Monocyte #: 1 x10 3/mm — ABNORMAL HIGH (ref 0.2–0.9)
NEUTROS PCT: 90.9 %
Neutrophil #: 16.8 x10 3/mm — ABNORMAL HIGH (ref 1.4–6.5)
PLATELETS: 171 x10 3/mm (ref 150–440)
RBC: 5.46 10*6/uL — ABNORMAL HIGH (ref 3.80–5.20)
RDW: 16.2 % — ABNORMAL HIGH (ref 11.5–14.5)
WBC: 18.5 x10 3/mm — ABNORMAL HIGH (ref 3.6–11.0)

## 2014-03-23 LAB — CEA: CEA: 347.6 ng/mL — AB (ref 0.0–4.7)

## 2014-03-24 LAB — CBC CANCER CENTER
BASOS PCT: 0.1 %
Basophil #: 0 x10 3/mm (ref 0.0–0.1)
EOS ABS: 0 x10 3/mm (ref 0.0–0.7)
Eosinophil %: 0 %
HCT: 41.1 % (ref 35.0–47.0)
HGB: 13.9 g/dL (ref 12.0–16.0)
Lymphocyte #: 0.4 x10 3/mm — ABNORMAL LOW (ref 1.0–3.6)
Lymphocyte %: 2.3 %
MCH: 25.5 pg — ABNORMAL LOW (ref 26.0–34.0)
MCHC: 33.8 g/dL (ref 32.0–36.0)
MCV: 76 fL — AB (ref 80–100)
Monocyte #: 0.8 x10 3/mm (ref 0.2–0.9)
Monocyte %: 4.7 %
Neutrophil #: 16.2 x10 3/mm — ABNORMAL HIGH (ref 1.4–6.5)
Neutrophil %: 92.9 %
Platelet: 130 x10 3/mm — ABNORMAL LOW (ref 150–440)
RBC: 5.44 10*6/uL — ABNORMAL HIGH (ref 3.80–5.20)
RDW: 16.1 % — AB (ref 11.5–14.5)
WBC: 17.5 x10 3/mm — ABNORMAL HIGH (ref 3.6–11.0)

## 2014-03-25 ENCOUNTER — Ambulatory Visit: Payer: Self-pay | Admitting: Oncology

## 2014-03-25 ENCOUNTER — Other Ambulatory Visit: Payer: Self-pay | Admitting: Diagnostic Radiology

## 2014-03-25 LAB — BASIC METABOLIC PANEL
Anion Gap: 9 (ref 7–16)
BUN: 26 mg/dL — AB (ref 7–18)
Calcium, Total: 8.4 mg/dL — ABNORMAL LOW (ref 8.5–10.1)
Chloride: 98 mmol/L (ref 98–107)
Co2: 27 mmol/L (ref 21–32)
Creatinine: 0.64 mg/dL (ref 0.60–1.30)
Glucose: 336 mg/dL — ABNORMAL HIGH (ref 65–99)
Osmolality: 286 (ref 275–301)
Potassium: 4.3 mmol/L (ref 3.5–5.1)
Sodium: 134 mmol/L — ABNORMAL LOW (ref 136–145)

## 2014-03-25 LAB — PROTIME-INR
INR: 1
Prothrombin Time: 13.4 secs (ref 11.5–14.7)

## 2014-03-28 ENCOUNTER — Inpatient Hospital Stay: Payer: Self-pay | Admitting: Oncology

## 2014-03-28 LAB — CBC CANCER CENTER
BASOS ABS: 0 x10 3/mm (ref 0.0–0.1)
BASOS PCT: 0.1 %
EOS ABS: 0 x10 3/mm (ref 0.0–0.7)
Eosinophil %: 0 %
HCT: 41.7 % (ref 35.0–47.0)
HGB: 14.1 g/dL (ref 12.0–16.0)
LYMPHS ABS: 0.3 x10 3/mm — AB (ref 1.0–3.6)
LYMPHS PCT: 1.6 %
MCH: 25.9 pg — AB (ref 26.0–34.0)
MCHC: 33.8 g/dL (ref 32.0–36.0)
MCV: 77 fL — ABNORMAL LOW (ref 80–100)
MONO ABS: 1 x10 3/mm — AB (ref 0.2–0.9)
MONOS PCT: 5.2 %
NEUTROS ABS: 17.8 x10 3/mm — AB (ref 1.4–6.5)
NEUTROS PCT: 93.1 %
PLATELETS: 97 x10 3/mm — AB (ref 150–440)
RBC: 5.43 10*6/uL — AB (ref 3.80–5.20)
RDW: 16.3 % — AB (ref 11.5–14.5)
WBC: 19.2 x10 3/mm — ABNORMAL HIGH (ref 3.6–11.0)

## 2014-03-28 LAB — URINALYSIS, COMPLETE
BLOOD: NEGATIVE
Bacteria: NONE SEEN
Bilirubin,UR: NEGATIVE
Glucose,UR: >=500 mg/dL (ref 0–75)
Hyaline Cast: 8
Ketone: NEGATIVE
LEUKOCYTE ESTERASE: NEGATIVE
NITRITE: NEGATIVE
PH: 5 (ref 4.5–8.0)
PROTEIN: NEGATIVE
RBC,UR: 2 /HPF (ref 0–5)
Specific Gravity: 1.021 (ref 1.003–1.030)
Squamous Epithelial: <1

## 2014-03-28 LAB — COMPREHENSIVE METABOLIC PANEL
ALK PHOS: 90 U/L
ALT: 94 U/L — AB
Albumin: 3 g/dL — ABNORMAL LOW (ref 3.4–5.0)
Anion Gap: 7 (ref 7–16)
BUN: 22 mg/dL — ABNORMAL HIGH (ref 7–18)
Bilirubin,Total: 1 mg/dL (ref 0.2–1.0)
CREATININE: 0.84 mg/dL (ref 0.60–1.30)
Calcium, Total: 8.5 mg/dL (ref 8.5–10.1)
Chloride: 96 mmol/L — ABNORMAL LOW (ref 98–107)
Co2: 29 mmol/L (ref 21–32)
EGFR (African American): >60
EGFR (Non-African Amer.): >60
Glucose: 324 mg/dL — ABNORMAL HIGH (ref 65–99)
OSMOLALITY: 280 (ref 275–301)
Potassium: 4.2 mmol/L (ref 3.5–5.1)
SGOT(AST): 38 U/L — ABNORMAL HIGH (ref 15–37)
Sodium: 132 mmol/L — ABNORMAL LOW (ref 136–145)
Total Protein: 6.1 g/dL — ABNORMAL LOW (ref 6.4–8.2)

## 2014-03-28 LAB — PROTIME-INR
INR: 1.2
Prothrombin Time: 15.1 secs — ABNORMAL HIGH (ref 11.5–14.7)

## 2014-03-28 LAB — APTT
Activated PTT: 97.2 secs — ABNORMAL HIGH (ref 23.6–35.9)
Activated PTT: 50.9 secs — ABNORMAL HIGH (ref 23.6–35.9)
Activated PTT: <23 secs — ABNORMAL LOW (ref 23.6–35.9)

## 2014-03-29 LAB — CBC WITH DIFFERENTIAL/PLATELET
BASOS ABS: 0 10*3/uL (ref 0.0–0.1)
BASOS PCT: 0.1 %
Eosinophil #: 0 10*3/uL (ref 0.0–0.7)
Eosinophil %: 0 %
HCT: 35.9 % (ref 35.0–47.0)
HGB: 12.4 g/dL (ref 12.0–16.0)
Lymphocyte #: 0.3 10*3/uL — ABNORMAL LOW (ref 1.0–3.6)
Lymphocyte %: 2.5 %
MCH: 26 pg (ref 26.0–34.0)
MCHC: 34.5 g/dL (ref 32.0–36.0)
MCV: 75 fL — ABNORMAL LOW (ref 80–100)
MONO ABS: 0.5 x10 3/mm (ref 0.2–0.9)
MONOS PCT: 4.3 %
Neutrophil #: 11.1 10*3/uL — ABNORMAL HIGH (ref 1.4–6.5)
Neutrophil %: 93.1 %
Platelet: 77 10*3/uL — ABNORMAL LOW (ref 150–440)
RBC: 4.76 10*6/uL (ref 3.80–5.20)
RDW: 16.3 % — AB (ref 11.5–14.5)
WBC: 11.9 10*3/uL — AB (ref 3.6–11.0)

## 2014-03-29 LAB — BASIC METABOLIC PANEL
Anion Gap: 7 (ref 7–16)
BUN: 16 mg/dL (ref 7–18)
CALCIUM: 7.3 mg/dL — AB (ref 8.5–10.1)
CHLORIDE: 102 mmol/L (ref 98–107)
CREATININE: 0.54 mg/dL — AB (ref 0.60–1.30)
Co2: 31 mmol/L (ref 21–32)
EGFR (Non-African Amer.): >60
GLUCOSE: 177 mg/dL — AB (ref 65–99)
OSMOLALITY: 285 (ref 275–301)
POTASSIUM: 4 mmol/L (ref 3.5–5.1)
Sodium: 140 mmol/L (ref 136–145)

## 2014-03-30 LAB — URINE CULTURE

## 2014-03-30 LAB — PLATELET COUNT: Platelet: 70 10*3/uL — ABNORMAL LOW (ref 150–440)

## 2014-04-01 LAB — PATHOLOGY REPORT

## 2014-04-13 ENCOUNTER — Ambulatory Visit: Payer: Self-pay | Admitting: Oncology

## 2014-04-13 DEATH — deceased

## 2014-10-04 NOTE — Consult Note (Signed)
Comments   I met with pt, her sister and nephew. Pt's sister/HCPOA was on phone. Pt has told family that she does not think she is "long for this world" and that her life is not "quality". Pt is not interested in tx of her presumed lung cancer. She wants her care to be focused on her comfort. We discussed the options of home with hospice vs Hospice Home. Pt does not feel she can make it at home and chooses the Hospice Home. Family all support pt and her wishes. Order entered. [facility] Home liason notified and will see pt. Dr Grayland Ormond notified.   Dx: stage IV colon cancer    Secondary Dx: probable lung cancer  Electronic Signatures: Blake Goya, Izora Gala (MD)  (Signed 19-Oct-15 14:16)  Authored: Palliative Care   Last Updated: 19-Oct-15 14:16 by Tomisha Reppucci, Izora Gala (MD)

## 2014-10-04 NOTE — Consult Note (Signed)
Reason for Visit: This 74 year old Female patient presents to the clinic for initial evaluation of  brain metastases .   Referred by Dr. Grayland Ormond.  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 3 lymph nodes were positive tumor was a T2 lesion. She underwent total of 12 cycles of FOLFOX chemotherapy which he tolerated well. Has been doing well up until recently when her CEA began to climb. She presented with right-sided weakness and some confusion mental thoughts. MRI of the brain was performed showing at least 5 hips intracranial masses suggestive of metastatic disease. There is associated significant vasogenic edema. 2 of the larger lesions were in the right and left frontal lobes.she's been started on steroid therapy. She is now referred for additional oncology for consideration of treatment. She's not having any significant headaches or further neurologic problems. She states her right-sided weakness has improved somewhat. PET CT scan is being planned to try to evaluate whether there is further other metastatic disease present.  Past Hx:    arthritis:    diet controlled diabetes:    urge incontinence:    colon cancer:    LE edema:    macular degeneration:    detached retina:    hysterectomy:    fibroids:    tonsils:   Past, Family and Social History:  Past Medical History positive   Cardiovascular hyperlipidemia; hypertension   Genitourinary urge incontinence   Endocrine diabetes mellitus   Past Surgical History tonsillectomy,  hysterectomy, fibroids, detached retina   Past Medical History Comments lower extremity edema, arthritis   Family History positive   Family History Comments family history positive for colon cancer also family history of breast lung cancer CVA hypertension   Social History noncontributory   Additional Past Medical and Surgical History accompanied by daughter today   Allergies:   Other -Explain in Comment Field: Rash  wool allergy: Rash  Home Meds:  Home Medications: Medication Instructions Status  Prevacid 30 mg oral delayed release capsule 1 cap(s) orally once a day Active  Coricidin HBP Maximum Strength Flu oral tablet 2 tab(s) orally every 6 hours, As Needed Active  Super B Complex oral tablet 1 tab(s) orally once a day Active  Mag-Ox 400 1 tab(s)  once a day Active  Glucosamine & Chondroitin with MSM 1 tab(s) orally 2 times a day Active  amLODIPine-atorvastatin 10 mg-10 mg oral tablet 1 tab(s) orally once a day Active  Fish Oil 1000 mg oral capsule 1 cap(s) orally once a day Active  Klor-Con M10 10 mEq oral tablet, extended release 1 tab(s) orally once a day Active  multivitamin 1 tab(s) orally once a day Active  Vitamin C 500 mg oral tablet 1 tab(s) orally once a day Active  vitamin E 400 intl units oral capsule 1 cap(s) orally once a day Active  Gingko Biloba - oral tablet 1 tab(s) orally once a day Active  Move Free 1 tab(s)  once a day Active  dexamethasone 8 milligram(s) orally 3 times a day Active  Toviaz 4 mg oral tablet, extended release 1 tab(s)  orally once a day Active  hydrochlorothiazide 25 mg oral tablet 1 tab(s) orally once a day (in the morning) Active  Calcium 600+D 1 tab(s) orally 2 times a day Active  aspirin 81 mg oral tablet 1 tab(s) orally once a day at hour of sleep Active   Review of Systems:  General negative   Performance Status (ECOG) 1   Skin negative   Breast negative   Ophthalmologic negative   ENMT negative   Respiratory and Thorax  negative   Cardiovascular negative   Gastrointestinal see HPI   Genitourinary negative   Musculoskeletal negative   Neurological negative   Psychiatric negative   Hematology/Lymphatics negative   Endocrine negative   Allergic/Immunologic negative   Review of Systems   except for some visual problems with a history of macular degeneration of the right eye as well as problems with right-sided weaknessdenies any weight loss, fatigue, weakness, fever, chills or night sweats. Patient denies any loss of vision, blurred vision. Patient denies any ringing  of the ears or hearing loss. No irregular heartbeat. Patient denies heart murmur or history of fainting. Patient denies any chest pain or pain radiating to her upper extremities. Patient denies any shortness of breath, difficulty breathing at night, cough or hemoptysis. Patient denies any swelling in the lower legs. Patient denies any nausea vomiting, vomiting of blood, or coffee ground material in the vomitus. Patient denies any stomach pain. Patient states has had normal bowel movements no significant constipation or diarrhea. Patient denies any dysuria, hematuria or significant nocturia. Patient denies any problems walking, swelling in the joints or loss of balance. Patient denies any skin changes, loss of hair or loss of weight. Patient denies any excessive worrying or anxiety or significant depression. Patient denies any problems with insomnia. Patient denies excessive thirst, polyuria, polydipsia. Patient denies any swollen glands, patient denies easy bruising or easy bleeding. Patient denies any recent infections, allergies or URI. Patient "s visual fields have not changed significantly in recent time.   Nursing Notes:  Nursing Vital Signs and Chemo Nursing Nursing Notes: *CC Vital Signs Flowsheet:   28-Sep-15 08:50  Temp Temperature 96.6  Pulse Pulse 94  Respirations Respirations 20  SBP SBP 135  DBP DBP 79  Pain Scale (0-10)  5   Pulse Oxi  97  Current Weight (kg) (kg) 93.7   Physical Exam:  General/Skin/HEENT:  Skin normal   Eyes normal   ENMT normal   Head and Neck normal   Additional PE well-developed female in NAD. She has subtle right-sided weakness of the upper and lower extremity. Left side is normal. Crit visual fields are within normal range. Proprioception is intact. Cranial nerves II through XII are grossly intact. Lungs are clear to A&P cardiac examination shows regular rate and rhythm.   Breasts/Resp/CV/GI/GU:  Respiratory and Thorax normal   Cardiovascular normal   Gastrointestinal normal   Genitourinary normal   MS/Neuro/Psych/Lymph:  Musculoskeletal normal   Neurological normal   Lymphatics normal   Other Results:  Radiology Results: MRI:    24-Sep-15 11:10, MRI Brain  With/Without Contrast  MRI Brain  With/Without Contrast   REASON FOR EXAM:    R sided weakness confusion  COMMENTS:       PROCEDURE: MMR - MMR BRAIN WO/W CONTRAST  - Mar 06 2014 11:10AM     CLINICAL DATA:  Muscle weakness. Unspecified psychosis. History of  colonic malignancy.    EXAM:  MRI HEAD WITHOUT ANDWITH CONTRAST    TECHNIQUE:  Multiplanar, multiecho pulse sequences of the brain and surrounding  structures were obtained without and with intravenous contrast.  CONTRAST:  MultiHance 20 mL.    COMPARISON:  None.    FINDINGS:  No acute stroke or hemorrhage. No hydrocephalus or extra-axial  fluid. Normal for age cerebral volume. No appreciable white matter  disease. No midline abnormality. No osseous findings. Flow voids are  maintained.    Post infusion, there are at least 5 intracranial mass lesions,  suggesting metastases. Two large mass lesions within the cerebral  hemispheres affect the RIGHT frontal and LEFT posterior frontal  regions respectively. There is moderate vasogenic edema surrounding  these two lesions. The large cerebral hemispheric lesions  demonstrate central T2  shortening suggesting mucin, consistent with  origin from colon cancer. No evidence for blood products. No  appreciable midline shift.    The dominant lesion in the RIGHT frontal subcortical white matter  measures 22 x 24 x 19 mm (R-L x A-P x C-C). A slightly smaller  lesion in the LEFT posterior frontal subcortical white matter  measures 17 x 18 x 18 mm. Additional small metastases as seen on  series 14 are seen in the RIGHT parietal cortex convexity (image43,  4 mm), LEFT lateral cerebellum (image 17, 4 mm), and RIGHT anterior  frontal cortex (image 42, 7 mm). Incidental venous angioma LEFT  cerebellum superiorly.    No osseous lesions are evident. There is no tonsillar herniation.  Grossly negative orbits.   IMPRESSION:  At least 5 intracranial mass lesions are present suggesting  metastases. There is marked vasogenic edema associated with the  largest lesions in the RIGHT and LEFT frontal lobes. Central T2  shortening without evidence for blood products suggests mucin,  probable metastatic colon cancer.    I personally discussed the findings with the ordering provider at  the time of interpretation.      Electronically Signed    By: Rolla Flatten M.D.    On: 03/06/2014 11:45     Verified By: Staci Righter, M.D.,   Relevent Results:   Relevant Scans and Labs MRI reviewed   Assessment and Plan: Impression:   brain metastasis with probable colon cancer in 74 year old female. Plan:   I discussed the case personally with medical oncology. We will be running a PET CT to try to identify possible other metastatic disease or persistent disease in this patient and possibly rule out another primary cancer. With 5 history of brain do not feel surgery is indicated. Would treat with whole brain up to 3200 cGy and then boost another 2000 cGy to the 2 larger lobe lesions with IM RT radiation therapy to spare normal brain eyes and brain stem. Risks and benefits of treatment including  possible cognitive decline, skin reaction, hair loss, alteration of blood counts were reviewed in detail with the patient. Her and her daughter both seem to comprehend my treatment plan well. I have set her up for CT simulation tomorrow. We'll make further recommendations once PET CT scan is reviewed.  I would like to take this opportunity for allowing me to participate in the care of your patient..  Fax to Physician:  Physicians To Recieve Fax: Dion Body, MD - [phone].  Electronic Signatures: Rahim Astorga, Roda Shutters (MD)  (Signed 28-Sep-15 14:10)  Authored: HPI, Diagnosis, Past Hx, PFSH, Allergies, Home Meds, ROS, Nursing Notes, Physical Exam, Other Results, Relevent Results, Encounter Assessment and Plan, Fax to Physician   Last Updated: 28-Sep-15 14:10 by Armstead Peaks (  MD) 

## 2014-10-05 NOTE — Op Note (Signed)
PATIENT NAME:  Wendy English, BRAZZEL MR#:  [id number] DATE OF BIRTH:  [birth year]  DATE OF PROCEDURE:  06/30/2011  PREOPERATIVE DIAGNOSIS: Carcinoma of the right colon.   POSTOPERATIVE DIAGNOSIS: Carcinoma of the right colon.   PROCEDURE: Laparoscopic right colectomy.   SURGEON: Renda Rolls, M.D.   ASSISTANT: Carmell Austria, PA  ANESTHESIA: General.   INDICATIONS: This 74 year old female had a colonoscopy demonstrating a cancer of the ascending colon that was an ulcerated mass and surgery was recommended for definitive treatment.   DESCRIPTION OF PROCEDURE: The patient was placed on the operating table in the supine position under general endotracheal anesthesia. The patient is 240 pounds with a BMI of 43.9 and a very large abdomen. This was prepared with ChloraPrep and draped in a sterile manner. I also noted there was an old lower abdominal midline incision related to her pelvic surgery. It is noted also that she has had a previous appendectomy incidental to her pelvic surgery.   Initial incision was made in the supraumbilical area. A small incision was carried down through subcutaneous tissues to encounter the deep fascia which was grasped with a laryngeal hook and elevated. A Veress needle was inserted, aspirated, and irrigated with a saline solution. Next, the peritoneal cavity was inflated with carbon dioxide. The Veress needle was removed. The 10 mm cannula was inserted. The 10 mm 30 degree laparoscope was inserted to view the peritoneal cavity. The location of the right colon was determined. The liver had no visible mass, but did have appearance of a fatty liver. Another incision was made in the infraumbilical area, in an old scar, for another 11 mm cannula. Another incision was made in the epigastrium, in the midline, for another 11 mm cannula. Another incision was made in the right mid abdomen to introduce an 11 mm cannula for a total of four cannulas. Next, the patient was tilted towards the  left side. The right colon was mobilized with incision of the lateral peritoneal reflection using the Harmonic scalpel. I noted the location of tattooing of the ascending colon and some of the dissection was carried out through some tattooed fatty tissue, although this appeared to be apart from the actual tumor. There were multiple adhesions in the pelvis, around her right ovary, and multiple adhesions between the small bowel and the pelvic sidewall. The patient was placed in Trendelenburg position and dissection was carried out mobilizing small bowel which some required extensive lysis of adhesions and subsequently freed up the terminal ileum to make it mobile enough for anastomosis. Next, further dissection was carried out along the ascending colon and hepatic flexure. Next, a portion of the omentum was separated from the right transverse colon and the right transverse colon was further dissected with the Harmonic scalpel mobilizing and identifying the duodenum and the mesentery was further separated from the duodenum, and after satisfactory mobilization of the colon by going back and forth between the transverse colon and the cecum and ascending colon, it was all mobilized satisfactorily for resection. Next, the laparoscopic instruments were removed. An incision was made from the supraumbilical port site to the epigastric port site and carried down through subcutaneous tissues through the midline fascia. It was found that the patient was quite obese and the incision was more than 2 inches deep to reach the fascia and it was necessary to lengthen the incision several times for exposure. There was marked palpable adenopathy of the mesentery. One lymph node was some 3 cm in dimension. The terminal ileum  was further examined and a point some 4 cm proximal to the ileocecal valve was selected for the proximal margin of resection. A window was created in the mesentery and the mesenteric dissection was begun with the  Harmonic scalpel. Next, a window was created in the transverse colon just to the right at the site of the middle colic vessels to create an opening to begin the mesenteric dissection of the transverse colon, which was carried out with the Harmonic scalpel. It is noted that there was marked adenopathy in the region of the ileocolic vessels. One ileocolic artery was divided with 0 chromic ligatures proximally and distally and divided with the Harmonic scalpel. A V-shaped dissection was carried out and at the apex of the V there was an approximately 3 cm lymph node. There was an artery leading up to the lymph node which was suture ligated with 0 chromic and also another vein that was suture ligated with 0 chromic. This was somewhat deep within the mesentery and was further dissected and freed up from the retroperitoneum.   Next, the small bowel was brought adjacent to the transverse colon. Some fatty tissue was cleared off the transverse colon for the anastomosis. An enterotomy was made and also a colotomy made and the GIA 75 stapler was introduced, engaged, and activated beginning the anastomosis. Next, the anastomosis was completed with application of the TA-60 stapler which was placed perpendicular to the first, engaged, and activated, and the specimen resected. The staple line was inspected. Several small bleeding points were cauterized. Hemostasis was subsequently intact. Gloves were changed. Next, the mesenteric defect was closed with running 3-0 chromic. The anastomosis looked good and was widely patent. Next, the omentum was brought beneath the wound as the bowel was placed back into the abdomen. Pull suction was used along the right side and there was just a minimal amount of serosanguineous fluid aspirated. Hemostasis appeared to be intact.   Next, the abdomen was closed with interrupted 0 Maxon figure-of-eight sutures in the fascia and the skin was closed with clips alternating with benzoin and  Steri-Strips. There was one small partially pedunculated seborrheic keratosis which was about 1 cm in dimension in the navel and this was excised with the scissors and a single 4-0 nylon stitch was placed to close this wound. It did not appear to need pathology for the seborrheic keratosis. The wounds were then dressed with cotton gauze and 2 inch paper tape. The patient tolerated surgery satisfactorily.   This operation was considerably more difficult and lengthy than the typical right colectomy due to extensive pelvic adhesions involving small bowel, also due to morbid obesity, and also due to the marked mesenteric adenopathy.   The patient tolerated the procedure satisfactorily and is now being prepared for transfer to the Recovery Room.  ____________________________ Shela CommonsJ. Renda RollsWilton Smith, MD jws:slb D: 06/30/2011 10:45:41 ET T: 06/30/2011 11:25:38 ET JOB#: [id number]  cc: Adella HareJ. Wilton Smith, MD, <Dictator> Adella HareWILTON J SMITH MD ELECTRONICALLY SIGNED 07/17/2011 15:15

## 2014-10-05 NOTE — Discharge Summary (Signed)
PATIENT NAME:  Wendy English, Javayah F MR#:  [id number] DATE OF BIRTH:  1940/07/13  DATE OF ADMISSION:  06/30/2011 DATE OF DISCHARGE:  07/04/2011  ADMITTING PHYSICIAN: Dr. Renda RollsWilton Smith   ADMISSION DIAGNOSES:  1. Right colon cancer. 2. Hypokalemia.   DISCHARGE DIAGNOSES:  1. Right colon cancer. 2. Hypokalemia.   PROCEDURE: Laparoscopic right colectomy on 06/30/2011.   HISTORY OF PRESENT ILLNESS: This is a 74 year old female with colonoscopy findings of cancer of the ascending colon. She presented for the above procedure electively.   HOSPITAL COURSE: Patient did well with surgery and was admitted to the floor after recovery. Patient had minimal pain after surgery. She used the morphine only once. She was up to the bathroom the afternoon after surgery and was walking in the halls the next day. She was able to start clear liquids on postoperative day #1 and advanced to regular diet by postoperative day 3. She had a bowel movement on postoperative day #2. Prior to surgery she did have hypokalemia with potassium of 3.4. She was on oral potassium replacement. After surgery she had IV fluids with KCl added. Potassium was rechecked while in the hospital. When she was back on her regular diet she was able to start her oral potassium supplement. On the day of discharge potassium had returned back from 2.7 to 3.4. On the day of discharge she was examined by myself and Dr. Katrinka BlazingSmith. She was tolerating her diet, not taking any pain medication. Had had two bowel movements. Abdomen was soft, nontender, nondistended. Incision intact with staples and minimal drainage. No erythema. Discharge plan was discussed with the patient and family.   DISPOSITION: Discharged home in satisfactory condition with self care.    MEDICATIONS:  1. Amlodipine atorvastatin 10/10 mg at bedtime.  2. Hydrochlorothiazide 25 mg daily.  3. Klor-Con 10 mEq daily.  4. Detrol LA 4 mg at bedtime.  5. Aspirin 81 mg daily.  6. Calcium 600+ D 1  tab b.i.d.  7. Vitamin C 500 mg at bedtime.  8. Fish oil 1000 mg daily.  9. Multivitamin daily.  10. Gingko biloba at bedtime.  11. Glucosamine/chondroitin with MSM b.i.d.  12. Vitamin E 400 international units at bedtime.  13. Tylenol as needed for pain.   INSTRUCTIONS:  1. Replace dressing as needed.  2. May shower.  3. Regular diet.  4. No exertional activity or heavy lifting.  5. Follow-up appointment next week for suture removal.  6. Call our office or report to the Emergency Room with increasing abdominal pain, nausea, vomiting, fever, or other concerns.  ____________________________ Ruffin FrederickAnn M. Collins, [place] amc:cms D: 07/11/2011 08:24:15 ET T: 07/11/2011 10:43:41 ET JOB#: [id number]  cc: Ruffin FrederickAnn M. Thomasena Edisollins, [place], <Dictator> ANN Narda AmberM COLLINS PA ELECTRONICALLY SIGNED 07/11/2011 14:19

## 2014-10-05 NOTE — Op Note (Signed)
PATIENT NAME:  [name], Wendy English#:  [id number] DATE OF BIRTH:  1941/02/27  DATE OF PROCEDURE:  07/20/2011  PREOPERATIVE DIAGNOSIS: Colon cancer.   POSTOPERATIVE DIAGNOSIS: Colon cancer.   PROCEDURE PERFORMED: Insertion of central venous catheter with subcutaneous infusion port.   SURGEON: Renda RollsWilton Karleen Seebeck, MD  ANESTHESIA: Local 1% Xylocaine with monitored anesthesia care and intravenous sedation.   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   DESCRIPTION OF PROCEDURE: The patient was placed on the operating table in the supine position under intravenous sedation and monitored by the anesthesia staff. The head was placed on a doughnut ring so that the neck was extended. The right subclavian area and the neck and surrounding chest wall were prepared with ChloraPrep and draped in a sterile manner. The skin beneath the clavicle was infiltrated with 1% Xylocaine. A transversely oriented 3 cm infraclavicular incision was made and carried down through subcutaneous tissues. A pouch was created in the subcutaneous tissues inferior to the incision large enough to admit the PFM port. Next, with the patient in the Trendelenburg position, the right jugular vein was identified with ultrasound, also could identify the carotid artery. The vein itself appeared normal. The skin overlying the jugular vein was infiltrated with Xylocaine. A transversely oriented 6 mm incision was made. A needle was introduced into the jugular vein using ultrasound guidance. An image was recorded. The syringe was used to aspirate some dark blood. Next, a guidewire was inserted into the vena cava and the needle was withdrawn. Fluoroscopy was used to demonstrate the position of the guidewire in the vena cava. Next, the dilator and introducer sheath were advanced over the guidewire.  The guidewire and dilator were removed. The catheter was inserted and the sheath was peeled away. Fluoroscopy was used to position the tip of the catheter in the superior vena cava. Next, the catheter was tunneled down to the subclavian port. It was cut to fit and attached to the PFM port using the accompanying sleeve. A Huber needle was used to access the port, aspirated a trace of blood, and then flushed with heparinized saline solution. Next, the port was positioned in the subcutaneous pouch and sutured to the deeper fatty tissues with 4-0 silk, also the fatty tissues were approximated with 5-0 Vicryl. Next, both skin incisions were closed with 5-0 Vicryl subcuticular suture and Dermabond. The patient tolerated surgery satisfactorily. I did inspect her abdominal incision which appear to be healing satisfactorily. Some Steri-Strips were removed. The patient was prepared for transfer to the Recovery Room.  ____________________________ Shela CommonsJ. Renda RollsWilton Kainan Patty, MD jws:[email or web address] D: 07/20/2011 08:56:47 ET T: 07/20/2011 10:21:10 ET JOB#: [id number]  cc: Adella HareJ. Wilton Conchita Truxillo, MD, <Dictator> Adella HareWILTON J Archibald Marchetta MD ELECTRONICALLY SIGNED 07/23/2011 13:56

## 2015-08-30 IMAGING — CR RIGHT HIP - COMPLETE 2+ VIEW
1 series · 3 of 3 positions shown · non-contrast
Comparison: CT 03/13/2014 and 07/14/2011

CLINICAL DATA: Generalized right knee and hip pain post fall not
sure how long ago.

EXAM:
RIGHT HIP - COMPLETE 2+ VIEW

[Series 1: t hip ap right · 0.14mm/px · 3 of 3 slices shown]
[im 1/3]
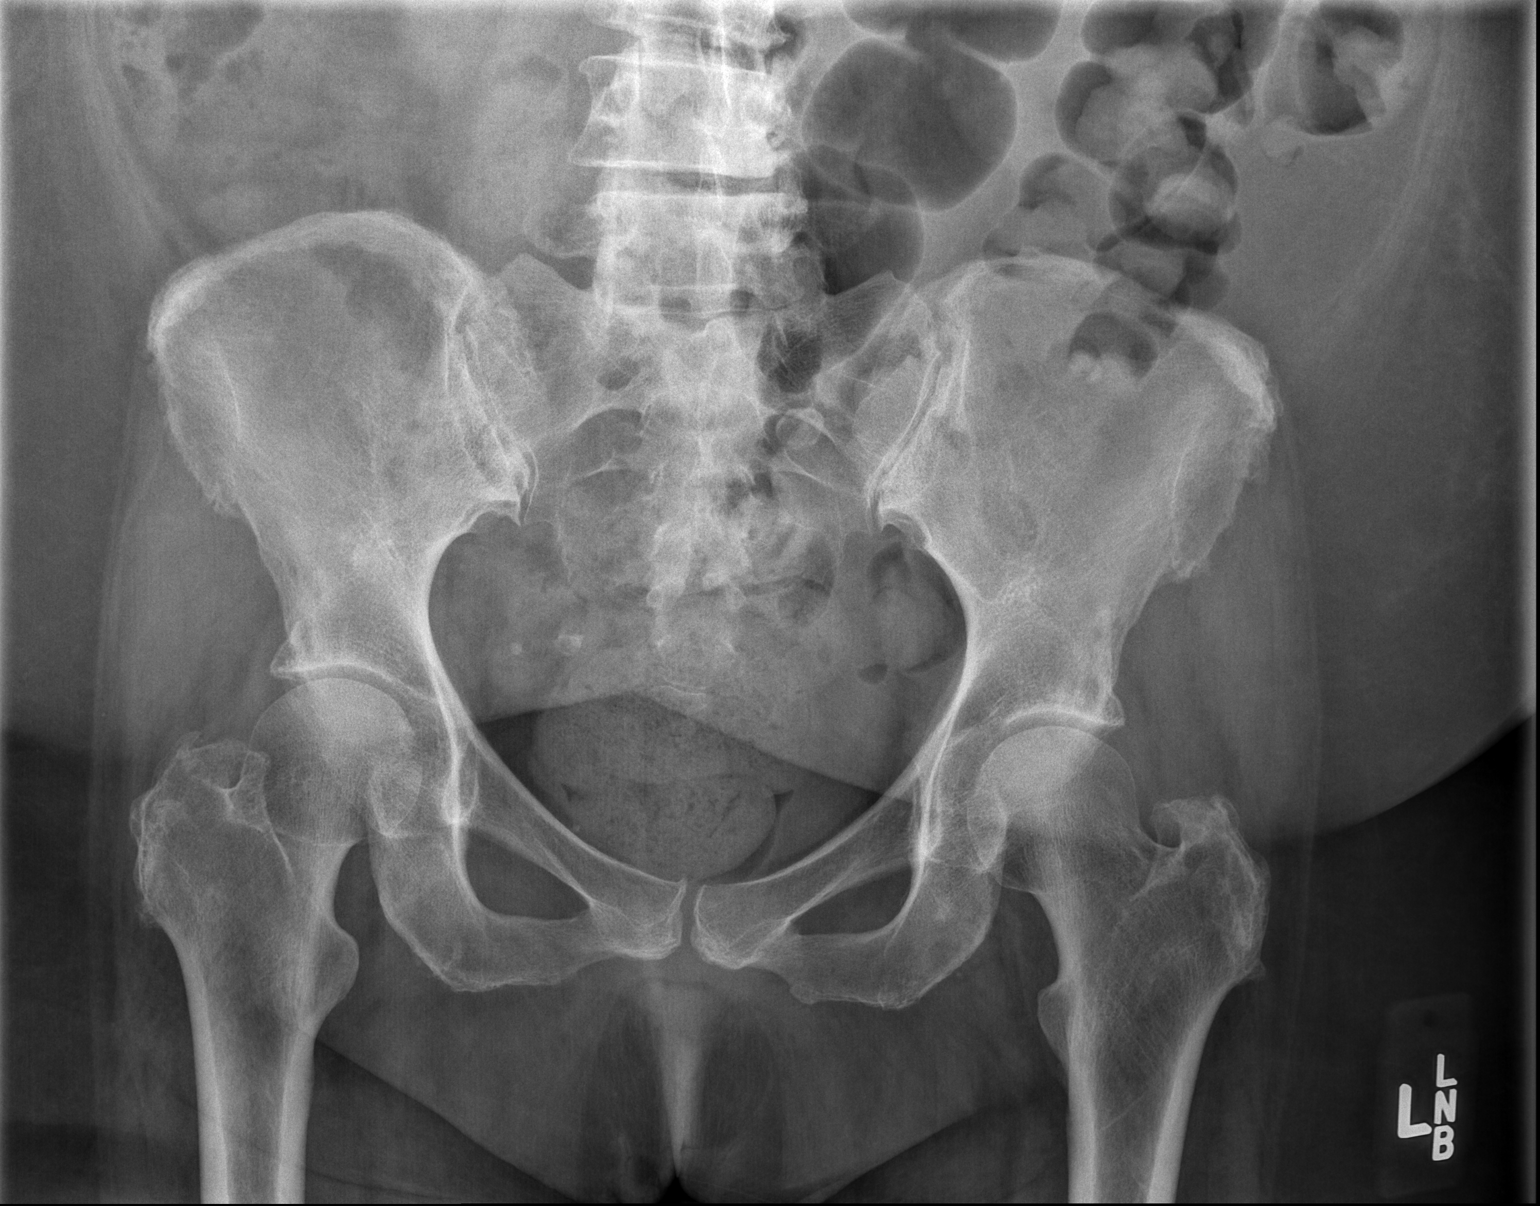
[im 2/3]
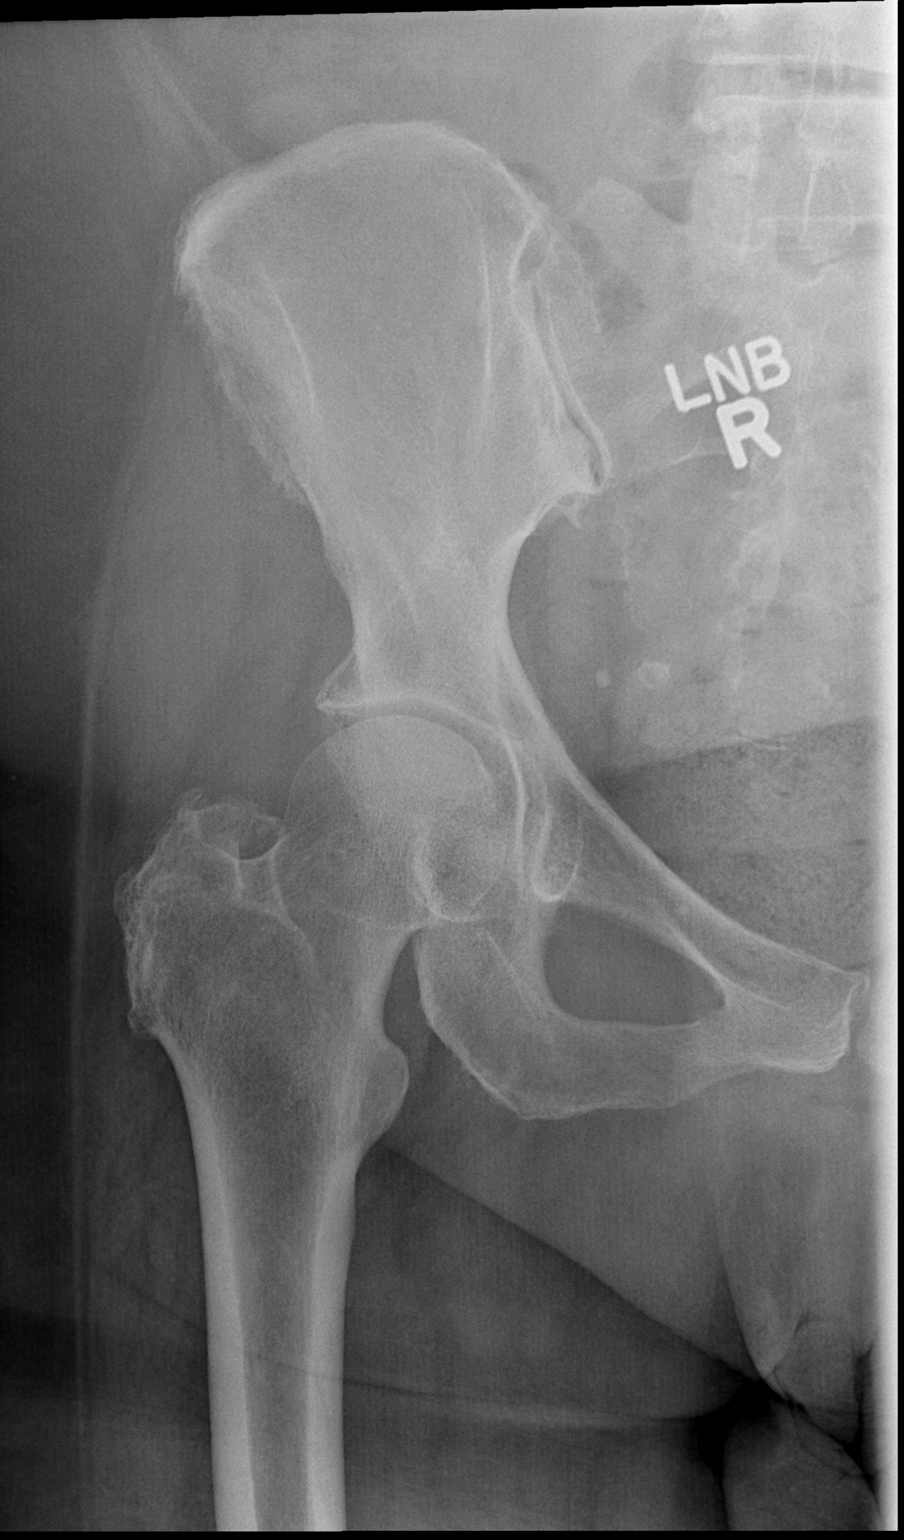
[im 3/3]
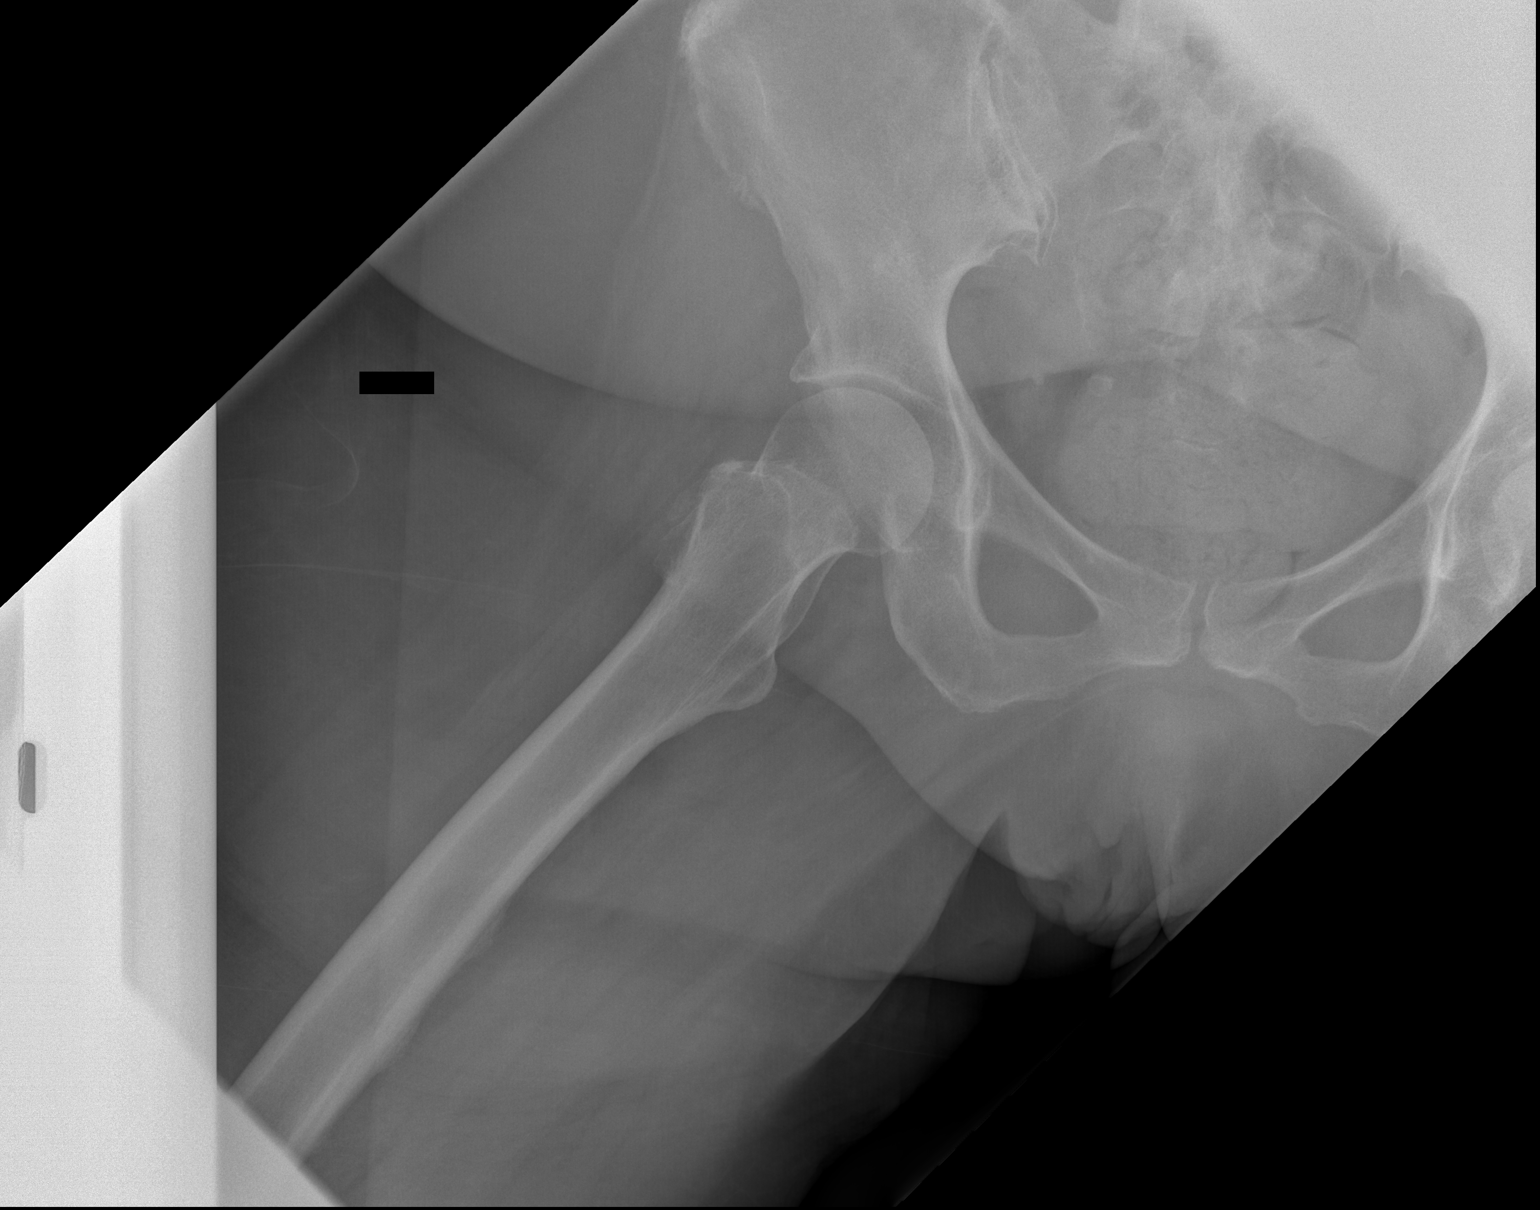

[3 of 3 positions shown; findings below may reference images not displayed]

FINDINGS: There are mild degenerative changes of the hips right greater than
left. There is no acute fracture or dislocation. There are mild
degenerative changes of the spine and sacroiliac joints. There is
mild fecal retention over the rectum.
IMPRESSION: Mild degenerative change of the hips right worse than left. No acute
findings.

## 2015-08-30 IMAGING — CT CT ASPIRATION
2 of 4 series · 12 of 32 positions shown, 17 images · non-contrast
Comparison: none

CLINICAL DATA: 72-year-old female with a history of colon
adenocarcinoma and brain metastases. Now she has a right upper lung
mass with multiple satellite nodules. Differential considerations
include metastatic colon cancer versus a new lung primary
malignancy. Two recent attempted biopsy were canceled 4 secondary
medical issues (hyperglycemia and atrial flutter). Patient currently
admitted for increasing confusion and weakness. In-patient CT-guided
lung biopsy is warranted to facilitate tissue diagnosis.
TECHNIQUE: Informed consent was obtained from the patient following explanation
of the procedure, risks, benefits and alternatives. The patient
understands, agrees and consents for the procedure. All questions
were addressed. A time out was performed.

[Series 5: (hospital) 4.8 b30s · axial · 0.74mm/px · z∈[-94,-86]mm · 4 of 32 slices shown, 9 images]
[im 7/32  soft-tissue]
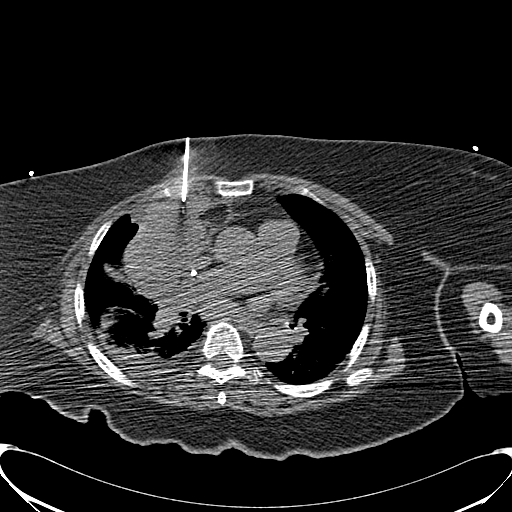
[im 7/32  lung]
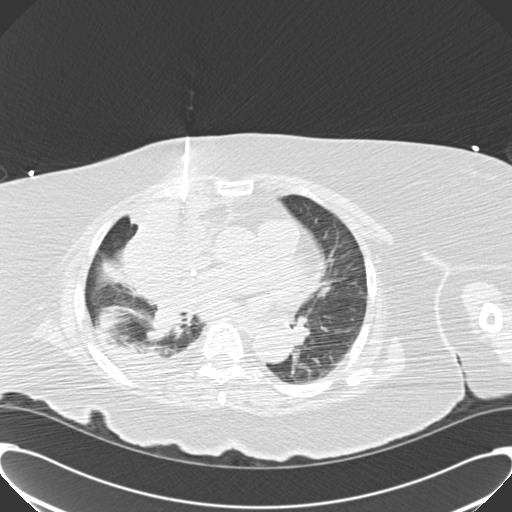
[im 7/32  bone]
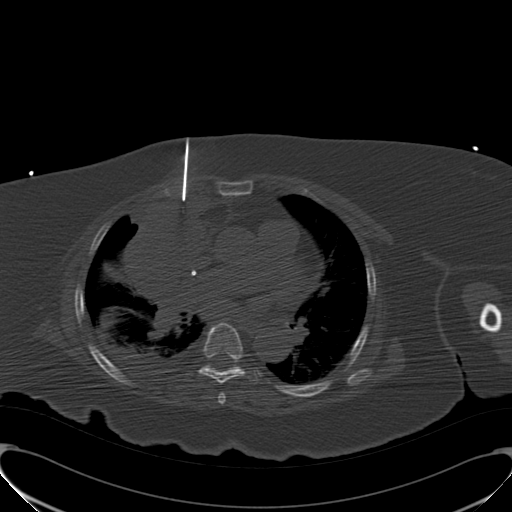
[im 13/32  soft-tissue]
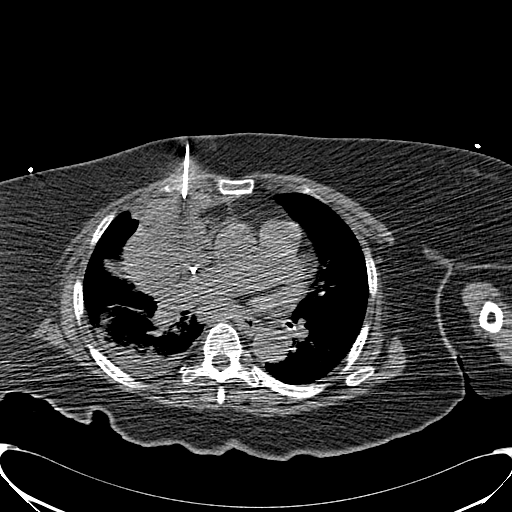
[im 13/32  lung]
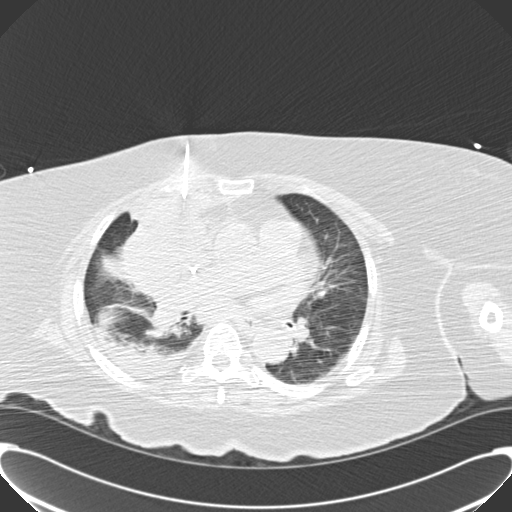
[im 19/32  soft-tissue]
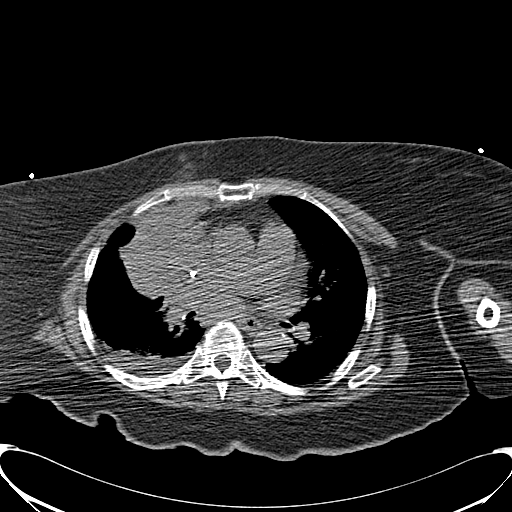
[im 19/32  lung]
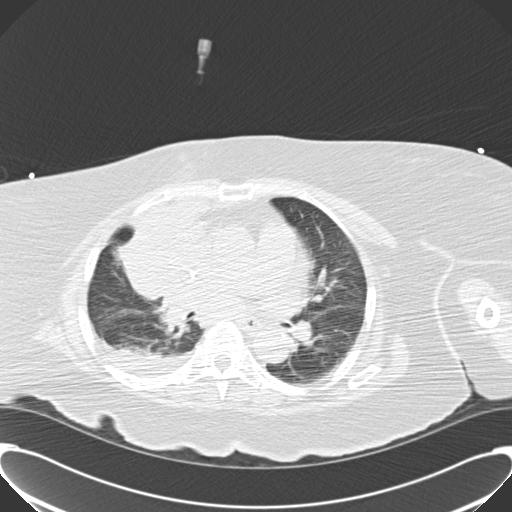
[im 25/32  soft-tissue]
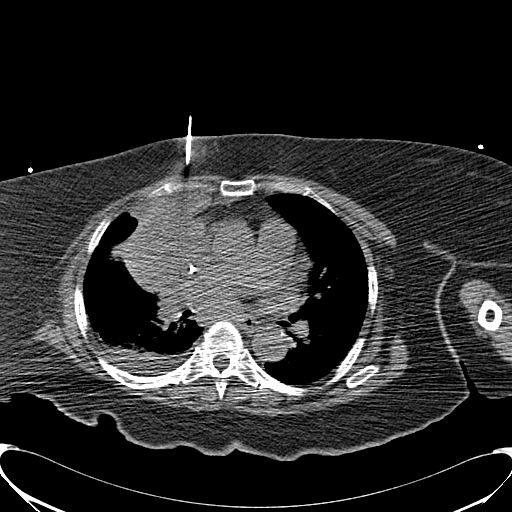
[im 25/32  lung]
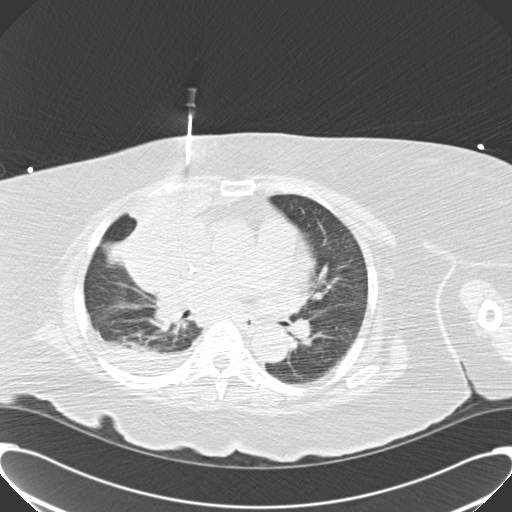

[Series 8: routine chest 2mm · axial · 0.85mm/px · z∈[-138,-2]mm · 8 of 80 slices shown]
[im 6/80  soft-tissue]
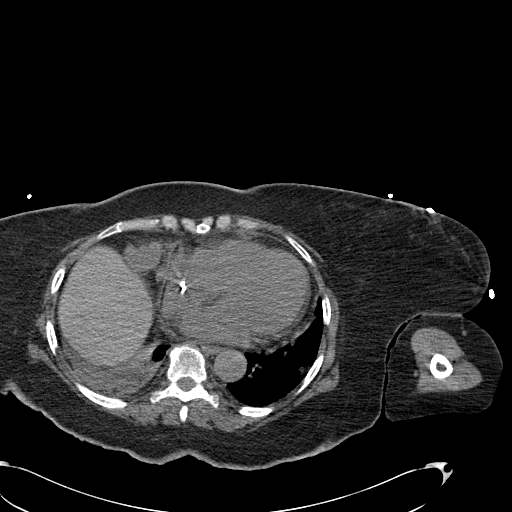
[im 17/80  soft-tissue]
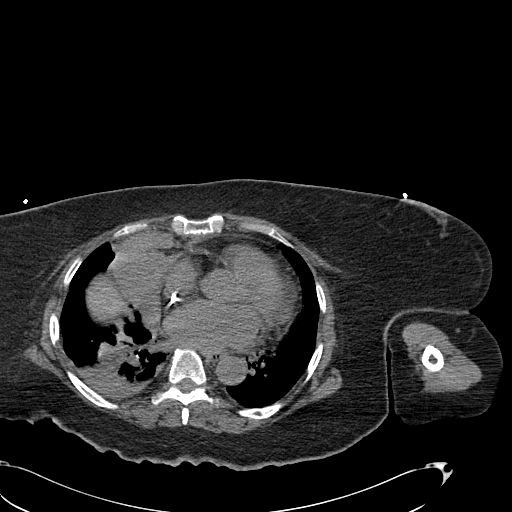
[im 29/80  soft-tissue]
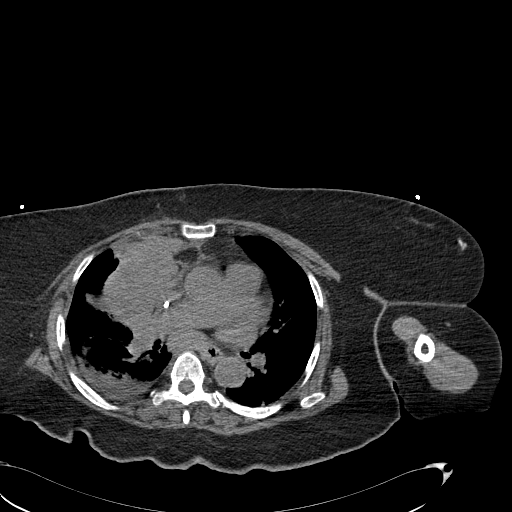
[im 34/80  soft-tissue]
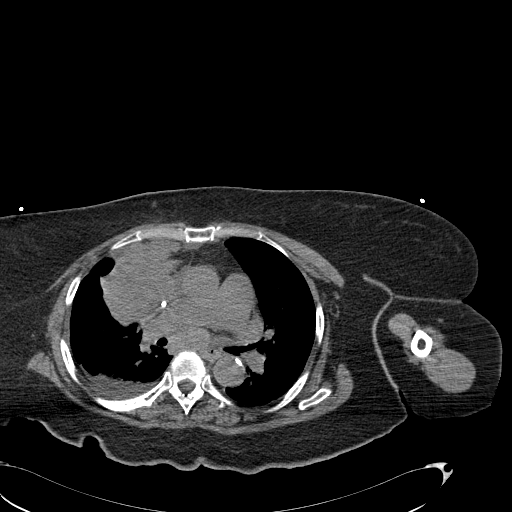
[im 46/80  soft-tissue]
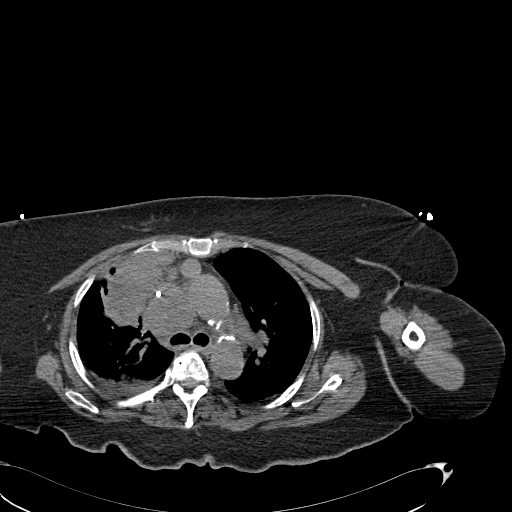
[im 51/80  soft-tissue]
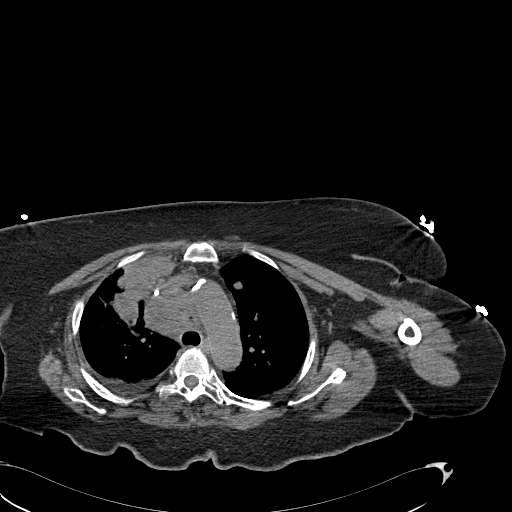
[im 63/80  soft-tissue]
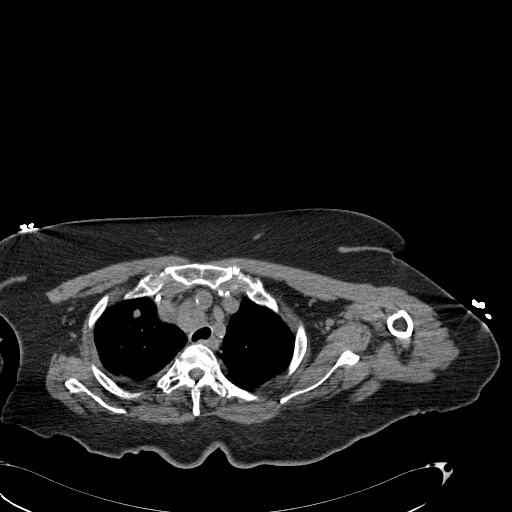
[im 74/80  soft-tissue]
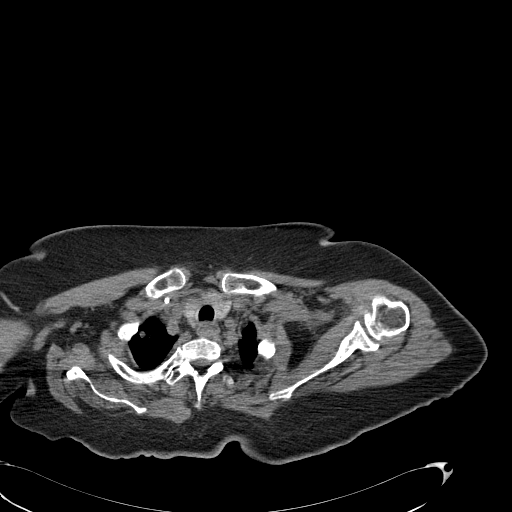

[12 of 32 positions shown; findings below may reference images not displayed]

EXAM:
CT BIOPSY

Date: 03/28/2014

PROCEDURE:
1. CT-guided biopsy of right upper lobe pulmonary mass

ANESTHESIA/SEDATION:
Moderate (conscious) sedation was used. One mg Versed, 50 mcg
Fentanyl were administered intravenously. The patient's vital signs
were monitored continuously by radiology nursing throughout the
procedure.

Sedation Time: 20 minutes
A planning axial CT scan was performed. The right upper lobe
pulmonary mass was successfully identified. A suitable skin entry
site was selected and marked. The region was then sterilely prepped
and draped in standard fashion using chlorhexidine skin prep. Local
anesthesia was attained by infiltration with 1% lidocaine. A small
dermatotomy was made. Under intermittent CT fluoroscopic guidance, a
17 gauge trocar needle was advanced into the margin the mass.
Multiple 18 gauge core biopsies were then coaxially obtained using
the Tsv Kubu automated biopsy device. The biopsy device and trocar
needle were removed. Axial CT imaging demonstrates no evidence of
pneumothorax, hemorrhage or other complicating feature. The patient
tolerated the procedure well.

Biopsy specimens were placed in formalin and delivered to pathology
for further evaluation.

COMPLICATIONS:
None
IMPRESSION: Technically successful CT-guided biopsy of right upper lobe
pulmonary mass.
